# Patient Record
Sex: Male | Born: 1990 | Race: Black or African American | Hispanic: No | Marital: Single | Smoking: Former smoker
Health system: Southern US, Community
[De-identification: ages and names within clinical notes are randomized; demographics above are authoritative.]

## PROBLEM LIST (undated history)

## (undated) DIAGNOSIS — J45909 Unspecified asthma, uncomplicated: Secondary | ICD-10-CM

---
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
## 2013-01-03 ENCOUNTER — Emergency Department (HOSPITAL_COMMUNITY): Payer: Self-pay

## 2013-01-03 ENCOUNTER — Emergency Department (INDEPENDENT_AMBULATORY_CARE_PROVIDER_SITE_OTHER)
Admission: EM | Admit: 2013-01-03 | Discharge: 2013-01-03 | Discharge status: Against Medical Advice | Payer: Self-pay | Source: Home / Self Care | Attending: Family Medicine | Admitting: Family Medicine

## 2013-01-03 ENCOUNTER — Encounter (HOSPITAL_COMMUNITY): Payer: Self-pay | Admitting: Emergency Medicine

## 2013-01-03 DIAGNOSIS — S59912A Unspecified injury of left forearm, initial encounter: Secondary | ICD-10-CM

## 2013-01-03 DIAGNOSIS — S6992XA Unspecified injury of left wrist, hand and finger(s), initial encounter: Secondary | ICD-10-CM

## 2013-01-03 DIAGNOSIS — S59919A Unspecified injury of unspecified forearm, initial encounter: Secondary | ICD-10-CM

## 2013-01-03 DIAGNOSIS — S6990XA Unspecified injury of unspecified wrist, hand and finger(s), initial encounter: Secondary | ICD-10-CM

## 2013-01-03 HISTORY — DX: Unspecified asthma, uncomplicated: J45.909

## 2013-01-03 NOTE — ED Notes (Signed)
Patient did not return and cannot be located
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
## 2013-01-03 NOTE — ED Notes (Signed)
Provided ice pack
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
## 2013-07-24 ENCOUNTER — Emergency Department (HOSPITAL_COMMUNITY): Payer: Self-pay

## 2013-07-24 ENCOUNTER — Encounter (HOSPITAL_COMMUNITY): Payer: Self-pay | Admitting: Emergency Medicine

## 2013-07-24 ENCOUNTER — Emergency Department (HOSPITAL_COMMUNITY)
Admission: EM | Admit: 2013-07-24 | Discharge: 2013-07-24 | Disposition: A | Discharge status: Home / Self Care | Payer: Self-pay | Attending: Emergency Medicine | Admitting: Emergency Medicine

## 2013-07-24 DIAGNOSIS — J069 Acute upper respiratory infection, unspecified: Secondary | ICD-10-CM | POA: Insufficient documentation

## 2013-07-24 DIAGNOSIS — J45901 Unspecified asthma with (acute) exacerbation: Secondary | ICD-10-CM | POA: Insufficient documentation

## 2013-07-24 DIAGNOSIS — R079 Chest pain, unspecified: Secondary | ICD-10-CM | POA: Insufficient documentation

## 2013-07-24 DIAGNOSIS — IMO0002 Reserved for concepts with insufficient information to code with codable children: Secondary | ICD-10-CM | POA: Insufficient documentation

## 2013-07-24 DIAGNOSIS — Z79899 Other long term (current) drug therapy: Secondary | ICD-10-CM | POA: Insufficient documentation

## 2013-07-24 MED ORDER — PREDNISONE 20 MG PO TABS: ORAL_TABLET | ORAL | Status: DC

## 2013-07-24 MED ORDER — HYDROCODONE-ACETAMINOPHEN 5-325 MG PO TABS: 2.0000 | ORAL_TABLET | Freq: Four times a day (QID) | ORAL | Status: DC | PRN

## 2013-07-24 MED ORDER — ALBUTEROL SULFATE HFA 108 (90 BASE) MCG/ACT IN AERS: 2.0000 | INHALATION_SPRAY | RESPIRATORY_TRACT | Status: DC | PRN

## 2013-07-24 NOTE — ED Provider Notes (Signed)
CSN: [id number]     Arrival date & time 07/24/13  1718 History   First MD Initiated Contact with Patient 07/24/13 1956     Chief Complaint  Patient presents with  . Cough  . URI   (Consider location/radiation/quality/duration/timing/severity/associated sxs/prior Treatment) HPI 22 year old male presents with 2 days of cough nasal congestion chest pain only with cough, felt like he was wheezing at home earlier but no wheezing or shortness of breath now, has known he was a child and felt similar earlier today, no fever no confusion no rash no vomiting no diarrhea no body aches and no treatment prior to arrival earlier felt like an inhaler would have helped.  Past Medical History  Diagnosis Date  . Asthma    History reviewed. No pertinent past surgical history. History reviewed. No pertinent family history. History  Substance Use Topics  . Smoking status: Never Smoker   . Smokeless tobacco: Not on file  . Alcohol Use: Yes    Review of Systems 10 Systems reviewed and are negative for acute change except as noted in the HPI. Allergies  Review of patient's allergies indicates no known allergies.  Home Medications   Current Outpatient Rx  Name  Route  Sig  Dispense  Refill  . guaiFENesin (MUCINEX) 600 MG 12 hr tablet   Oral   Take 1,200 mg by mouth 2 (two) times daily as needed for cough or to loosen phlegm.         . Pseudoephedrine-APAP-DM (DAYQUIL PO)   Oral   Take 2 capsules by mouth every 6 (six) hours as needed (for cold symptoms).         Marland Kitchen albuterol (PROVENTIL HFA;VENTOLIN HFA) 108 (90 BASE) MCG/ACT inhaler   Inhalation   Inhale 2 puffs into the lungs every 2 (two) hours as needed for wheezing or shortness of breath (cough).   1 Inhaler   0   . HYDROcodone-acetaminophen (NORCO) 5-325 MG per tablet   Oral   Take 2 tablets by mouth every 6 (six) hours as needed for severe pain.   10 tablet   0   . predniSONE (DELTASONE) 20 MG tablet      3 tabs po daily x 3  days   9 tablet   0    BP 110/60  Pulse 67  Temp(Src) 98 F (36.7 C) (Oral)  Resp 20  SpO2 100% Physical Exam  Nursing note and vitals reviewed. Constitutional:  Awake, alert, nontoxic appearance.  HENT:  Head: Atraumatic.  Eyes: Right eye exhibits no discharge. Left eye exhibits no discharge.  Neck: Neck supple.  Cardiovascular: Normal rate and regular rhythm.   No murmur heard. Pulmonary/Chest: Effort normal and breath sounds normal. No respiratory distress. He has no wheezes. He has no rales. He exhibits tenderness.  Pulse oximetry normal room air 98%  Abdominal: Soft. He exhibits no distension. There is no tenderness. There is no rebound and no guarding.  Musculoskeletal: He exhibits no tenderness.  Baseline ROM, no obvious new focal weakness.  Neurological: He is alert.  Mental status and motor strength appears baseline for patient and situation.  Skin: No rash noted.  Psychiatric: He has a normal mood and affect.    ED Course  Procedures (including critical care time) Patient / Family / Caregiver informed of clinical course, understand medical decision-making process, and agree with plan. Labs Review Labs Reviewed - No data to display Imaging Review Dg Chest 2 View (if Patient Has Fever And/or Copd)  07/24/2013  CLINICAL DATA:  COUGH URI,  EXAM: CHEST  2 VIEW  COMPARISON:  DG CHEST 2 VIEW dated 07/15/2007  FINDINGS: Normal mediastinum and cardiac silhouette. Normal pulmonary vasculature. No evidence of effusion, infiltrate, or pneumothorax. No acute bony abnormality.  IMPRESSION: Normal chest radiograph   Electronically Signed   By: Genevive Bi M.D.   On: 07/24/2013 18:03    EKG Interpretation   None       MDM   1. URI (upper respiratory infection)    I doubt any other EMC precluding discharge at this time including, but not necessarily limited to the following:SBI.    Hurman Horn, MD 07/24/13 2125

## 2013-07-24 NOTE — ED Notes (Signed)
Pt c/o nasal congestion and URI sx with cough that is painful x 2 days

## 2013-10-25 ENCOUNTER — Encounter (HOSPITAL_COMMUNITY): Payer: Self-pay | Admitting: Emergency Medicine

## 2013-10-25 DIAGNOSIS — Z79899 Other long term (current) drug therapy: Secondary | ICD-10-CM | POA: Insufficient documentation

## 2013-10-25 DIAGNOSIS — Z792 Long term (current) use of antibiotics: Secondary | ICD-10-CM | POA: Insufficient documentation

## 2013-10-25 DIAGNOSIS — J45909 Unspecified asthma, uncomplicated: Secondary | ICD-10-CM | POA: Insufficient documentation

## 2013-10-25 DIAGNOSIS — K029 Dental caries, unspecified: Secondary | ICD-10-CM | POA: Insufficient documentation

## 2013-10-25 DIAGNOSIS — K047 Periapical abscess without sinus: Secondary | ICD-10-CM | POA: Insufficient documentation

## 2013-10-25 DIAGNOSIS — IMO0002 Reserved for concepts with insufficient information to code with codable children: Secondary | ICD-10-CM | POA: Insufficient documentation

## 2013-10-25 NOTE — ED Notes (Signed)
Pt. reports progressing left lower molar pain with left lower jaw swelling onset yesterday unrelieved by OTC Ibuprofen .

## 2013-10-26 ENCOUNTER — Emergency Department (HOSPITAL_COMMUNITY)
Admission: EM | Admit: 2013-10-26 | Discharge: 2013-10-26 | Disposition: A | Discharge status: Home / Self Care | Payer: Self-pay | Attending: Emergency Medicine | Admitting: Emergency Medicine

## 2013-10-26 DIAGNOSIS — K047 Periapical abscess without sinus: Secondary | ICD-10-CM

## 2013-10-26 MED ORDER — HYDROCODONE-ACETAMINOPHEN 5-325 MG PO TABS: 2.0000 | ORAL_TABLET | ORAL | Status: DC | PRN

## 2013-10-26 MED ORDER — AMOXICILLIN 500 MG PO CAPS: 500.0000 mg | ORAL_CAPSULE | Freq: Three times a day (TID) | ORAL | Status: DC

## 2013-10-26 NOTE — Discharge Instructions (Signed)
Dental Abscess A dental abscess is a collection of infected fluid (pus) from a bacterial infection in the inner part of the tooth (pulp). It usually occurs at the end of the tooth's root.  CAUSES   Severe tooth decay.  Trauma to the tooth that allows bacteria to enter into the pulp, such as a broken or chipped tooth. SYMPTOMS   Severe pain in and around the infected tooth.  Swelling and redness around the abscessed tooth or in the mouth or face.  Tenderness.  Pus drainage.  Bad breath.  Bitter taste in the mouth.  Difficulty swallowing.  Difficulty opening the mouth.  Nausea.  Vomiting.  Chills.  Swollen neck glands. DIAGNOSIS   A medical and dental history will be taken.  An examination will be performed by tapping on the abscessed tooth.  X-rays may be taken of the tooth to identify the abscess. TREATMENT The goal of treatment is to eliminate the infection. You may be prescribed antibiotic medicine to stop the infection from spreading. A root canal may be performed to save the tooth. If the tooth cannot be saved, it may be pulled (extracted) and the abscess may be drained.  HOME CARE INSTRUCTIONS  Only take over-the-counter or prescription medicines for pain, fever, or discomfort as directed by your caregiver.  Rinse your mouth (gargle) often with salt water ( tsp salt in 8 oz [250 ml] of warm water) to relieve pain or swelling.  Do not drive after taking pain medicine (narcotics).  Do not apply heat to the outside of your face.  Return to your dentist for further treatment as directed. SEEK MEDICAL CARE IF:  Your pain is not helped by medicine.  Your pain is getting worse instead of better. SEEK IMMEDIATE MEDICAL CARE IF:  You have a fever or persistent symptoms for more than 2 3 days.  You have a fever and your symptoms suddenly get worse.  You have chills or a very bad headache.  You have problems breathing or swallowing.  You have trouble  opening your mouth.  You have swelling in the neck or around the eye. Document Released: 07/12/2005 Document Revised: 04/05/2012 Document Reviewed: 10/20/2010 [facility] Patient Information 2014 [place], [place].  Dental Care and Dentist Visits Dental care supports good overall health. Regular dental visits can also help you avoid dental pain, bleeding, infection, and other more serious health problems in the future. It is important to keep the mouth healthy because diseases in the teeth, gums, and other oral tissues can spread to other areas of the body. Some problems, such as diabetes, heart disease, and pre-term labor have been associated with poor oral health.  See your dentist every 6 months. If you experience emergency problems such as a toothache or broken tooth, go to the dentist right away. If you see your dentist regularly, you may catch problems early. It is easier to be treated for problems in the early stages.  WHAT TO EXPECT AT A DENTIST VISIT  Your dentist will look for many common oral health problems and recommend proper treatment. At your regular dental visit, you can expect:  Gentle cleaning of the teeth and gums. This includes scraping and polishing. This helps to remove the sticky substance around the teeth and gums (plaque). Plaque forms in the mouth shortly after eating. Over time, plaque hardens on the teeth as tartar. If tartar is not removed regularly, it can cause problems. Cleaning also helps remove stains.  Periodic X-rays. These pictures of the teeth  and supporting bone will help your dentist assess the health of your teeth.  Periodic fluoride treatments. Fluoride is a natural mineral shown to help strengthen teeth. Fluoride treatmentinvolves applying a fluoride gel or varnish to the teeth. It is most commonly done in children.  Examination of the mouth, tongue, jaws, teeth, and gums to look for any oral health problems, such as:  Cavities (dental caries). This is  decay on the tooth caused by plaque, sugar, and acid in the mouth. It is best to catch a cavity when it is small.  Inflammation of the gums caused by plaque buildup (gingivitis).  Problems with the mouth or malformed or misaligned teeth.  Oral cancer or other diseases of the soft tissues or jaws. KEEP YOUR TEETH AND GUMS HEALTHY For healthy teeth and gums, follow these general guidelines as well as your dentist's specific advice:  Have your teeth professionally cleaned at the dentist every 6 months.  Brush twice daily with a fluoride toothpaste.  Floss your teeth daily.  Ask your dentist if you need fluoride supplements, treatments, or fluoride toothpaste.  Eat a healthy diet. Reduce foods and drinks with added sugar.  Avoid smoking. TREATMENT FOR ORAL HEALTH PROBLEMS If you have oral health problems, treatment varies depending on the conditions present in your teeth and gums.  Your caregiver will most likely recommend good oral hygiene at each visit.  For cavities, gingivitis, or other oral health disease, your caregiver will perform a procedure to treat the problem. This is typically done at a separate appointment. Sometimes your caregiver will refer you to another dental specialist for specific tooth problems or for surgery. SEEK IMMEDIATE DENTAL CARE IF:  You have pain, bleeding, or soreness in the gum, tooth, jaw, or mouth area.  A permanent tooth becomes loose or separated from the gum socket.  You experience a blow or injury to the mouth or jaw area. Document Released: 03/24/2011 Document Revised: 10/04/2011 Document Reviewed: 03/24/2011 [facility] Patient Information 2014 [place]

## 2013-10-26 NOTE — ED Provider Notes (Signed)
CSN: [id number]     Arrival date & time 10/25/13  2225 History   First MD Initiated Contact with Patient 10/26/13 0003     Chief Complaint  Patient presents with  . Dental Pain     (Consider location/radiation/quality/duration/timing/severity/associated sxs/prior Treatment) HPI  Patient presents to the emergency department with a dental complaint. Symptoms began 2 days ago. The patient has tried to alleviate pain with ibuprofen.  Pain rated at a 10/10, characterized as throbbing in nature and located left lower molar. Patient denies fever, night sweats, chills, difficulty swallowing or opening mouth, SOB, nuchal rigidity or decreased ROM of neck.  Patient does not have a dentist and requests a resource guide at discharge.  Past Medical History  Diagnosis Date  . Asthma    History reviewed. No pertinent past surgical history. No family history on file. History  Substance Use Topics  . Smoking status: Never Smoker   . Smokeless tobacco: Not on file  . Alcohol Use: Yes    Review of Systems  The patient denies anorexia, fever, weight loss,, vision loss, decreased hearing, hoarseness, chest pain, syncope, dyspnea on exertion, peripheral edema, balance deficits, hemoptysis, abdominal pain, melena, hematochezia, severe indigestion/heartburn, hematuria, incontinence, genital sores, muscle weakness, suspicious skin lesions, transient blindness, difficulty walking, depression, unusual weight change, abnormal bleeding, enlarged lymph nodes, angioedema, and breast masses.    Allergies  Review of patient's allergies indicates no known allergies.  Home Medications   Current Outpatient Rx  Name  Route  Sig  Dispense  Refill  . albuterol (PROVENTIL HFA;VENTOLIN HFA) 108 (90 BASE) MCG/ACT inhaler   Inhalation   Inhale 2 puffs into the lungs every 2 (two) hours as needed for wheezing or shortness of breath (cough).   1 Inhaler   0   . amoxicillin (AMOXIL) 500 MG capsule   Oral   Take 1  capsule (500 mg total) by mouth 3 (three) times daily.   21 capsule   0   . guaiFENesin (MUCINEX) 600 MG 12 hr tablet   Oral   Take 1,200 mg by mouth 2 (two) times daily as needed for cough or to loosen phlegm.         Marland Kitchen HYDROcodone-acetaminophen (NORCO) 5-325 MG per tablet   Oral   Take 2 tablets by mouth every 6 (six) hours as needed for severe pain.   10 tablet   0   . HYDROcodone-acetaminophen (NORCO/VICODIN) 5-325 MG per tablet   Oral   Take 2 tablets by mouth every 4 (four) hours as needed.   6 tablet   0   . predniSONE (DELTASONE) 20 MG tablet      3 tabs po daily x 3 days   9 tablet   0   . Pseudoephedrine-APAP-DM (DAYQUIL PO)   Oral   Take 2 capsules by mouth every 6 (six) hours as needed (for cold symptoms).          BP 122/69  Pulse 60  Temp(Src) 97.8 F (36.6 C) (Oral)  Resp 14  Ht 5\' 5"  (1.651 m)  Wt 138 lb (62.596 kg)  BMI 22.96 kg/m2  SpO2 99% Physical Exam  Nursing note and vitals reviewed. Constitutional: He appears well-developed and well-nourished. No distress.  HENT:  Head: Normocephalic and atraumatic.  Mouth/Throat: Dental caries present.    Eyes: Conjunctivae and EOM are normal. Pupils are equal, round, and reactive to light.  Neck: Normal range of motion. Neck supple.  Cardiovascular: Normal rate and regular rhythm.  Pulmonary/Chest: Effort normal and breath sounds normal.  Abdominal: Soft.  Neurological: He is alert.  Skin: Skin is warm and dry.      ED Course  Procedures (including critical care time) Labs Review Labs Reviewed - No data to display Imaging Review No results found.   EKG Interpretation None      MDM   Final diagnoses:  Dental abscess    Patient has dental pain. No emergent s/sx's present. Patent airway. No trismus.  Will be given pain medication and antibiotics. I discussed the need to call dentist within 24/48 hours for follow-up. Dental referral given. Return to ED precautions given.  Pt  voiced understanding and has agreed to follow-up.   23 y.o.Vincent West's evaluation in the Emergency Department is complete. It has been determined that no acute conditions requiring further emergency intervention are present at this time. The patient/guardian have been advised of the diagnosis and plan. We have discussed signs and symptoms that warrant return to the ED, such as changes or worsening in symptoms.  Vital signs are stable at discharge. Filed Vitals:   10/25/13 2313  BP: 122/69  Pulse: 60  Temp: 97.8 F (36.6 C)  Resp: 14    Patient/guardian has voiced understanding and agreed to follow-up with the PCP or specialist.     Dorthula Matasiffany G Zyra Parrillo, PA-C 10/26/13 0025

## 2013-10-27 NOTE — ED Provider Notes (Signed)
Medical screening examination/treatment/procedure(s) were performed by non-physician practitioner and as supervising physician I was immediately available for consultation/collaboration.   EKG Interpretation None        Gilda Creasehristopher J. Bobetta Korf, MD 10/27/13 [phone]

## 2013-11-03 ENCOUNTER — Emergency Department (HOSPITAL_COMMUNITY)
Admission: EM | Admit: 2013-11-03 | Discharge: 2013-11-03 | Disposition: A | Discharge status: Home / Self Care | Payer: Self-pay | Attending: Emergency Medicine | Admitting: Emergency Medicine

## 2013-11-03 ENCOUNTER — Encounter (HOSPITAL_COMMUNITY): Payer: Self-pay | Admitting: Emergency Medicine

## 2013-11-03 DIAGNOSIS — Z79899 Other long term (current) drug therapy: Secondary | ICD-10-CM | POA: Insufficient documentation

## 2013-11-03 DIAGNOSIS — J45909 Unspecified asthma, uncomplicated: Secondary | ICD-10-CM | POA: Insufficient documentation

## 2013-11-03 DIAGNOSIS — J039 Acute tonsillitis, unspecified: Secondary | ICD-10-CM | POA: Insufficient documentation

## 2013-11-03 DIAGNOSIS — J029 Acute pharyngitis, unspecified: Secondary | ICD-10-CM

## 2013-11-03 DIAGNOSIS — F17200 Nicotine dependence, unspecified, uncomplicated: Secondary | ICD-10-CM | POA: Insufficient documentation

## 2013-11-03 LAB — RAPID STREP SCREEN (MED CTR MEBANE ONLY): Streptococcus, Group A Screen (Direct): NEGATIVE

## 2013-11-03 MED ORDER — IBUPROFEN 400 MG PO TABS
800.0000 mg | ORAL_TABLET | Freq: Once | ORAL | Status: AC
  Administered 2013-11-03: 800 mg via ORAL
  Filled 2013-11-03: qty 2

## 2013-11-03 MED ORDER — IBUPROFEN 800 MG PO TABS
800.0000 mg | ORAL_TABLET | Freq: Three times a day (TID) | ORAL | Status: DC
Start: 2013-11-03 — End: 2015-01-19

## 2013-11-03 MED ORDER — PENICILLIN G BENZATHINE 1200000 UNIT/2ML IM SUSP
1.2000 10*6.[IU] | Freq: Once | INTRAMUSCULAR | Status: AC
  Administered 2013-11-03: 1.2 10*6.[IU] via INTRAMUSCULAR
  Filled 2013-11-03: qty 2

## 2013-11-03 NOTE — Discharge Instructions (Signed)
Treated today for her tonsillitis and covered for strep throat. Take ibuprofen as directed for pain and swelling. Rest and stay well-hydrated.  Pharyngitis Pharyngitis is redness, pain, and swelling (inflammation) of your pharynx.  CAUSES  Pharyngitis is usually caused by infection. Most of the time, these infections are from viruses (viral) and are part of a cold. However, sometimes pharyngitis is caused by bacteria (bacterial). Pharyngitis can also be caused by allergies. Viral pharyngitis may be spread from person to person by coughing, sneezing, and personal items or utensils (cups, forks, spoons, toothbrushes). Bacterial pharyngitis may be spread from person to person by more intimate contact, such as kissing.  SIGNS AND SYMPTOMS  Symptoms of pharyngitis include:   Sore throat.   Tiredness (fatigue).   Low-grade fever.   Headache.  Joint pain and muscle aches.  Skin rashes.  Swollen lymph nodes.  Plaque-like film on throat or tonsils (often seen with bacterial pharyngitis). DIAGNOSIS  Your health care provider will ask you questions about your illness and your symptoms. Your medical history, along with a physical exam, is often all that is needed to diagnose pharyngitis. Sometimes, a rapid strep test is done. Other lab tests may also be done, depending on the suspected cause.  TREATMENT  Viral pharyngitis will usually get better in 3 4 days without the use of medicine. Bacterial pharyngitis is treated with medicines that kill germs (antibiotics).  HOME CARE INSTRUCTIONS   Drink enough water and fluids to keep your urine clear or pale yellow.   Only take over-the-counter or prescription medicines as directed by your health care provider:   If you are prescribed antibiotics, make sure you finish them even if you start to feel better.   Do not take aspirin.   Get lots of rest.   Gargle with 8 oz of salt water ( tsp of salt per 1 qt of water) as often as every 1 2  hours to soothe your throat.   Throat lozenges (if you are not at risk for choking) or sprays may be used to soothe your throat. SEEK MEDICAL CARE IF:   You have large, tender lumps in your neck.  You have a rash.  You cough up green, yellow-brown, or bloody spit. SEEK IMMEDIATE MEDICAL CARE IF:   Your neck becomes stiff.  You drool or are unable to swallow liquids.  You vomit or are unable to keep medicines or liquids down.  You have severe pain that does not go away with the use of recommended medicines.  You have trouble breathing (not caused by a stuffy nose). MAKE SURE YOU:   Understand these instructions.  Will watch your condition.  Will get help right away if you are not doing well or get worse. Document Released: 07/12/2005 Document Revised: 05/02/2013 Document Reviewed: 03/19/2013 [facility] Patient Information 2014 [place], [place].  Tonsillitis Tonsillitis is an infection of the throat that causes the tonsils to become red, tender, and swollen. Tonsils are collections of lymphoid tissue at the back of the throat. Each tonsil has crevices (crypts). Tonsils help fight nose and throat infections and keep infection from spreading to other parts of the body for the first 18 months of life.  CAUSES Sudden (acute) tonsillitis is usually caused by infection with streptococcal bacteria. Long-lasting (chronic) tonsillitis occurs when the crypts of the tonsils become filled with pieces of food and bacteria, which makes it easy for the tonsils to become repeatedly infected. SYMPTOMS  Symptoms of tonsillitis include:  A sore throat, with  possible difficulty swallowing.  White patches on the tonsils.  Fever.  Tiredness.  New episodes of snoring during sleep, when you did not snore before.  Small, foul-smelling, yellowish-white pieces of material (tonsilloliths) that you occasionally cough up or spit out. The tonsilloliths can also cause you to have bad  breath. DIAGNOSIS Tonsillitis can be diagnosed through a physical exam. Diagnosis can be confirmed with the results of lab tests, including a throat culture. TREATMENT  The goals of tonsillitis treatment include the reduction of the severity and duration of symptoms and prevention of associated conditions. Symptoms of tonsillitis can be improved with the use of steroids to reduce the swelling. Tonsillitis caused by bacteria can be treated with antibiotics. Usually, treatment with antibiotics is started before the cause of the tonsillitis is known. However, if it is determined that the cause is not bacterial, antibiotics will not treat the tonsillitis. If attacks of tonsillitis are severe and frequent, your caregiver may recommend surgery to remove the tonsils (tonsillectomy). HOME CARE INSTRUCTIONS   Rest as much as possible and get plenty of sleep.  Drink plenty of fluids. While the throat is very sore, eat soft foods or liquids, such as sherbet, soups, or instant breakfast drinks.  Eat frozen ice pops.  Gargle with a warm or cold liquid to help soothe the throat. Mix 1/4 teaspoon of salt and 1/4 teaspoon of baking soda in in 8 oz of water. SEEK MEDICAL CARE IF:   Large, tender lumps develop in your neck.  A rash develops.  A green, yellow-brown, or bloody substance is coughed up.  You are unable to swallow liquids or food for 24 hours.  You notice that only one of the tonsils is swollen. SEEK IMMEDIATE MEDICAL CARE IF:   You develop any new symptoms such as vomiting, severe headache, stiff neck, chest pain, or trouble breathing or swallowing.  You have severe throat pain along with drooling or voice changes.  You have severe pain, unrelieved with recommended medications.  You are unable to fully open the mouth.  You develop redness, swelling, or severe pain anywhere in the neck.  You have a fever. MAKE SURE YOU:   Understand these instructions.  Will watch your  condition.  Will get help right away if you are not doing well or get worse. Document Released: 04/21/2005 Document Revised: 03/14/2013 Document Reviewed: 12/29/2012 [facility] Patient Information 2014 [place], [place].

## 2013-11-03 NOTE — ED Provider Notes (Signed)
CSN: [id number]     Arrival date & time 11/03/13  1848 History  This chart was scribed for non-physician practitioner Johnnette Gourdobyn Albert, PA working with Richardean Canalavid H Yao, MD by Elveria Risingimelie Horne, ED Scribe. This patient was seen in room TR07C/TR07C and the patient's care was started at 7:49 PM.   Chief Complaint  Patient presents with  . Sore Throat      The history is provided by the patient. No language interpreter was used.   HPI Comments: Vincent West is a 23 y.o. male who presents to the Emergency Department complaining of sore throat onset, yesterday. Today he says he woke and the sore throat had worsened. Patient reports pain with swallowing: rated at 8/10. Patient is able to tolerably swallow liquids, but not food. Associated symptoms include lightheadedness, fever, myalgia and headache. Patient attempted treatment with ibuprofen and rest and experienced relief of his fever. Patient denies sick contacts.     Past Medical History  Diagnosis Date  . Asthma    History reviewed. No pertinent past surgical history. No family history on file. History  Substance Use Topics  . Smoking status: Current Every Day Smoker  . Smokeless tobacco: Not on file  . Alcohol Use: Yes    Review of Systems A complete 10 system review of systems was obtained and all systems are negative except as noted in the HPI and PMH.   Allergies  Review of patient's allergies indicates no known allergies.  Home Medications   Current Outpatient Rx  Name  Route  Sig  Dispense  Refill  . albuterol (PROVENTIL HFA;VENTOLIN HFA) 108 (90 BASE) MCG/ACT inhaler   Inhalation   Inhale 2 puffs into the lungs every 2 (two) hours as needed for wheezing or shortness of breath (cough).   1 Inhaler   0   . ibuprofen (ADVIL,MOTRIN) 800 MG tablet   Oral   Take 1 tablet (800 mg total) by mouth 3 (three) times daily.   21 tablet   0    BP 130/77  Pulse 90  Temp(Src) 98.6 F (37 C) (Oral)  Resp 20  Ht 5\' 5"  (1.651 m)  Wt  138 lb (62.596 kg)  BMI 22.96 kg/m2  SpO2 100%  Physical Exam  Nursing note and vitals reviewed. Constitutional: He is oriented to person, place, and time. He appears well-developed and well-nourished. No distress.  HENT:  Head: Normocephalic and atraumatic.  Mouth/Throat: Uvula is midline and mucous membranes are normal. Oropharyngeal exudate, posterior oropharyngeal edema and posterior oropharyngeal erythema present. No tonsillar abscesses.  Tonsils enlarged and inflamed bilateral with exudate. No tonsillar abscess.   Eyes: Conjunctivae and EOM are normal.  Neck: Normal range of motion. Neck supple.  Cardiovascular: Normal rate, regular rhythm and normal heart sounds.   Pulmonary/Chest: Effort normal and breath sounds normal.  Musculoskeletal: Normal range of motion. He exhibits no edema.  Lymphadenopathy:       Head (right side): Tonsillar adenopathy present.       Head (left side): Tonsillar adenopathy present.    He has cervical adenopathy.  Neurological: He is alert and oriented to person, place, and time.  Skin: Skin is warm and dry.  Psychiatric: He has a normal mood and affect. His behavior is normal.    ED Course  Procedures (including critical care time) DIAGNOSTIC STUDIES: Oxygen Saturation is 100% on room air, normal by my interpretation.    COORDINATION OF CARE: 7:06 PM- Pt advised of plan for treatment and pt agrees.  Labs Review Labs Reviewed  RAPID STREP SCREEN  CULTURE, GROUP A STREP   Imaging Review No results found.   EKG Interpretation None      MDM   Final diagnoses:  Pharyngitis  Tonsillitis with exudate   Patient well-appearing and in no apparent distress, afebrile with normal vital signs. Tonsils and oropharynx enlarged and inflamed bilateral with exudate, will treat with Bicillin 1.2 million units IM despite rapid strep negative. Advised ibuprofen for pain. Swallows liquids in the ED without difficulty. Stable for discharge. Return  precautions given. Patient states understanding of treatment care plan and is agreeable.    I personally performed the services described in this documentation, which was scribed in my presence. The recorded information has been reviewed and is accurate.   Trevor Mace, PA-C 11/03/13 1952

## 2013-11-03 NOTE — ED Notes (Addendum)
Patient complaining of dizziness, headache, and body aches all over after waking up with a sore throat starting yesterday morning.

## 2013-11-03 NOTE — ED Provider Notes (Signed)
Medical screening examination/treatment/procedure(s) were performed by non-physician practitioner and as supervising physician I was immediately available for consultation/collaboration.   EKG Interpretation None        Dashon Mcintire H Boomer Winders, MD 11/03/13 2319 

## 2013-11-03 NOTE — ED Notes (Addendum)
Pt reports sore throat onset yesterday, back pain and headache. Pt tried ibuprofen with some relief. Pt presents with redness and swelling to tonsil area

## 2013-11-05 LAB — CULTURE, GROUP A STREP

## 2014-11-07 ENCOUNTER — Encounter (HOSPITAL_COMMUNITY): Payer: Self-pay | Admitting: Emergency Medicine

## 2014-11-07 ENCOUNTER — Emergency Department (HOSPITAL_COMMUNITY)
Admission: EM | Admit: 2014-11-07 | Discharge: 2014-11-07 | Disposition: A | Discharge status: Home / Self Care | Payer: Self-pay | Attending: Emergency Medicine | Admitting: Emergency Medicine

## 2014-11-07 DIAGNOSIS — Z720 Tobacco use: Secondary | ICD-10-CM | POA: Insufficient documentation

## 2014-11-07 DIAGNOSIS — J029 Acute pharyngitis, unspecified: Secondary | ICD-10-CM | POA: Insufficient documentation

## 2014-11-07 DIAGNOSIS — J45909 Unspecified asthma, uncomplicated: Secondary | ICD-10-CM | POA: Insufficient documentation

## 2014-11-07 MED ORDER — AMOXICILLIN 500 MG PO CAPS: 500.0000 mg | ORAL_CAPSULE | Freq: Three times a day (TID) | ORAL | Status: DC

## 2014-11-07 NOTE — Discharge Instructions (Signed)

## 2014-11-07 NOTE — ED Provider Notes (Signed)
CSN: [id number]     Arrival date & time 11/07/14  1446 History   This chart was scribed for Arthor Captain, PA-C working with Vanetta Mulders, MD by Evon Slack, ED Scribe. This patient was seen in room TR04C/TR04C and the patient's care was started at 3:02 PM.   Chief Complaint  Patient presents with  . Sore Throat   The history is provided by the patient. No language interpreter was used.   HPI Comments: Vincent West is a 24 y.o. male who presents to the Emergency Department complaining of  newsore throat onset 1 day ago. Pt states that he woke up with sore throat that has been progressively worsening. Pt reports associated subject fever, chills and nausea. Pt doesn't report any alleviating factors.  Pt states that he is unsure of sick contacts. Pt denies any other associated symptoms.   Past Medical History  Diagnosis Date  . Asthma    History reviewed. No pertinent past surgical history. History reviewed. No pertinent family history. History  Substance Use Topics  . Smoking status: Current Every Day Smoker  . Smokeless tobacco: Not on file  . Alcohol Use: Yes    Review of Systems  Constitutional: Positive for fever and chills.  HENT: Positive for sore throat. Negative for trouble swallowing and voice change.   Gastrointestinal: Positive for nausea.  All other systems reviewed and are negative.     Allergies  Review of patient's allergies indicates no known allergies.  Home Medications   Prior to Admission medications   Medication Sig Start Date End Date Taking? Authorizing Provider  albuterol (PROVENTIL HFA;VENTOLIN HFA) 108 (90 BASE) MCG/ACT inhaler Inhale 2 puffs into the lungs every 2 (two) hours as needed for wheezing or shortness of breath (cough). 07/24/13   Wayland Salinas, MD  ibuprofen (ADVIL,MOTRIN) 800 MG tablet Take 1 tablet (800 mg total) by mouth 3 (three) times daily. 11/03/13   Robyn M Hess, PA-C   Pulse 93  Temp(Src) 98.2 F (36.8 C) (Oral)  Resp  18  SpO2 98%   Physical Exam  Constitutional: He is oriented to person, place, and time. He appears well-developed and well-nourished. No distress.  HENT:  Head: Normocephalic and atraumatic.  Mouth/Throat: Uvula swelling present. Oropharyngeal exudate, posterior oropharyngeal edema and posterior oropharyngeal erythema present.  tonsillar hypertrophy with bilateral exudates, erythema and swelling of the uvula he is able to tolerate his own secretions.   Eyes: Conjunctivae and EOM are normal.  Neck: Neck supple. No tracheal deviation present.  Cardiovascular: Normal rate.   Pulmonary/Chest: Effort normal. No respiratory distress.  Musculoskeletal: Normal range of motion.  Neurological: He is alert and oriented to person, place, and time.  Skin: Skin is warm and dry.  Psychiatric: He has a normal mood and affect. His behavior is normal.  Nursing note and vitals reviewed.   ED Course  Procedures (including critical care time) DIAGNOSTIC STUDIES: Oxygen Saturation is 98% on RA, normal by my interpretation.    COORDINATION OF CARE: 3:10 PM-Discussed treatment plan with pt at bedside and pt agreed to plan.     Labs Review Labs Reviewed - No data to display  Imaging Review No results found.   EKG Interpretation None      MDM   Final diagnoses:  Acute pharyngitis, unspecified pharyngitis type   Patient with swelling, erythema exudate. D/c with amoxil. Presentation non concerning for PTA or infxn spread to soft tissue. No trismus or uvula deviation. Specific return precautions discussed. Pt able to drink  water in ED without difficulty with intact air way. Recommended PCP follow up.      I personally performed the services described in this documentation, which was scribed in my presence. The recorded information has been reviewed and is accurate.        Arthor Captainbigail Vianny Schraeder, PA-C 11/11/14 1758  Vanetta MuldersScott Zackowski, MD 11/15/14 [phone]

## 2014-11-07 NOTE — ED Notes (Signed)
Pt reports the right side of tonsil is sore; white patches and swelling in upper area. No changes in voice.

## 2015-01-19 ENCOUNTER — Encounter (HOSPITAL_COMMUNITY): Payer: Self-pay | Admitting: Emergency Medicine

## 2015-01-19 ENCOUNTER — Emergency Department (HOSPITAL_COMMUNITY)
Admission: EM | Admit: 2015-01-19 | Discharge: 2015-01-19 | Disposition: A | Discharge status: Home / Self Care | Payer: Self-pay | Attending: Emergency Medicine | Admitting: Emergency Medicine

## 2015-01-19 DIAGNOSIS — J45909 Unspecified asthma, uncomplicated: Secondary | ICD-10-CM | POA: Insufficient documentation

## 2015-01-19 DIAGNOSIS — R110 Nausea: Secondary | ICD-10-CM | POA: Insufficient documentation

## 2015-01-19 DIAGNOSIS — Z720 Tobacco use: Secondary | ICD-10-CM | POA: Insufficient documentation

## 2015-01-19 DIAGNOSIS — F10120 Alcohol abuse with intoxication, uncomplicated: Secondary | ICD-10-CM | POA: Insufficient documentation

## 2015-01-19 LAB — CBC WITH DIFFERENTIAL/PLATELET
Basophils Absolute: 0 10*3/uL (ref 0.0–0.1)
Basophils Relative: 0 % (ref 0–1)
Eosinophils Absolute: 0.1 10*3/uL (ref 0.0–0.7)
Eosinophils Relative: 1 % (ref 0–5)
HCT: 40.8 % (ref 39.0–52.0)
Hemoglobin: 14.7 g/dL (ref 13.0–17.0)
Lymphocytes Relative: 9 % — ABNORMAL LOW (ref 12–46)
Lymphs Abs: 1.3 10*3/uL (ref 0.7–4.0)
MCH: 30.2 pg (ref 26.0–34.0)
MCHC: 36 g/dL (ref 30.0–36.0)
MCV: 84 fL (ref 78.0–100.0)
Monocytes Absolute: 1.3 10*3/uL — ABNORMAL HIGH (ref 0.1–1.0)
Monocytes Relative: 9 % (ref 3–12)
Neutro Abs: 11.5 10*3/uL — ABNORMAL HIGH (ref 1.7–7.7)
Neutrophils Relative %: 81 % — ABNORMAL HIGH (ref 43–77)
Platelets: 270 10*3/uL (ref 150–400)
RBC: 4.86 MIL/uL (ref 4.22–5.81)
RDW: 13.3 % (ref 11.5–15.5)
WBC: 14.2 10*3/uL — ABNORMAL HIGH (ref 4.0–10.5)

## 2015-01-19 LAB — URINE MICROSCOPIC-ADD ON

## 2015-01-19 LAB — URINALYSIS, ROUTINE W REFLEX MICROSCOPIC
Bilirubin Urine: NEGATIVE
Glucose, UA: NEGATIVE mg/dL
Hgb urine dipstick: NEGATIVE
Ketones, ur: 15 mg/dL — AB
Nitrite: NEGATIVE
Protein, ur: NEGATIVE mg/dL
Specific Gravity, Urine: 1.03 (ref 1.005–1.030)
Urobilinogen, UA: 0.2 mg/dL (ref 0.0–1.0)
pH: 6 (ref 5.0–8.0)

## 2015-01-19 LAB — COMPREHENSIVE METABOLIC PANEL
ALT: 13 U/L — ABNORMAL LOW (ref 17–63)
AST: 30 U/L (ref 15–41)
Albumin: 4.5 g/dL (ref 3.5–5.0)
Alkaline Phosphatase: 59 U/L (ref 38–126)
Anion gap: 12 (ref 5–15)
BUN: 11 mg/dL (ref 6–20)
CO2: 22 mmol/L (ref 22–32)
Calcium: 9.1 mg/dL (ref 8.9–10.3)
Chloride: 106 mmol/L (ref 101–111)
Creatinine, Ser: 0.97 mg/dL (ref 0.61–1.24)
GFR calc Af Amer: >60 mL/min (ref >60)
GFR calc non Af Amer: >60 mL/min (ref >60)
Glucose, Bld: 103 mg/dL — ABNORMAL HIGH (ref 65–99)
Potassium: 4 mmol/L (ref 3.5–5.1)
Sodium: 140 mmol/L (ref 135–145)
Total Bilirubin: 0.7 mg/dL (ref 0.3–1.2)
Total Protein: 7.5 g/dL (ref 6.5–8.1)

## 2015-01-19 LAB — ETHANOL: Alcohol, Ethyl (B): <5 mg/dL (ref <5)

## 2015-01-19 LAB — LIPASE, BLOOD: Lipase: 19 U/L — ABNORMAL LOW (ref 22–51)

## 2015-01-19 MED ORDER — SODIUM CHLORIDE 0.9 % IV BOLUS (SEPSIS)
1000.0000 mL | Freq: Once | INTRAVENOUS | Status: AC
  Administered 2015-01-19: 1000 mL via INTRAVENOUS

## 2015-01-19 MED ORDER — PROMETHAZINE HCL 25 MG PO TABS: 25.0000 mg | ORAL_TABLET | Freq: Three times a day (TID) | ORAL | Status: DC | PRN

## 2015-01-19 MED ORDER — PROMETHAZINE HCL 25 MG/ML IJ SOLN
25.0000 mg | Freq: Once | INTRAMUSCULAR | Status: AC
  Administered 2015-01-19: 25 mg via INTRAVENOUS
  Filled 2015-01-19: qty 1

## 2015-01-19 NOTE — ED Notes (Addendum)
Pt here due to new onset dizziness, nauseas/vomitting, and intermittent numbness to left arm. Pt reports going to a party last night and having some "white liquor" - pt states he felt fine until this morning. Pt has had 1 episode of vomiting. Pt is dizzy and states he fell in bathroom in ED waiting room, unwitnessed; however pt denies LOC and denies hitting head, fell to right side, no deformities, denies pain from fall. Pt reports pain 7/10 in abdomen in RUQ. Last BM yesterday. Denies diarrhea. Pt is a/o x4.

## 2015-01-19 NOTE — Discharge Instructions (Signed)
Return here as needed.  Follow-up with a primary care Dr. increase her fluid intake, rest as much as possible

## 2015-01-19 NOTE — ED Provider Notes (Signed)
CSN: [id number]     Arrival date & time 01/19/15  2993 History   First MD Initiated Contact with Patient 01/19/15 [phone]     Chief Complaint  Patient presents with  . Numbness  . Nausea  . Dizziness     (Consider location/radiation/quality/duration/timing/severity/associated sxs/prior Treatment) HPI Patient presents to the emergency department with nausea, vomiting, dizziness that started this morning.  The patient states that he has not gone to bed and he was out partying last night.  He states that during his vomiting episodes.  He did not notice dizziness with intermittent numbness in his arms.  The patient states that he drank some white liquor out of water bottles.  The patient states that he did not have any chest pain, shortness of breath, headache, blurred vision, weakness, abdominal pain, diarrhea, bloody stool, dysuria, or syncope.  The patient states that the thing seems make his condition, better or worse.  He did not take any medications prior to arrival for her symptoms Past Medical History  Diagnosis Date  . Asthma    History reviewed. No pertinent past surgical history. History reviewed. No pertinent family history. History  Substance Use Topics  . Smoking status: Current Every Day Smoker -- 0.50 packs/day    Types: Cigarettes  . Smokeless tobacco: Not on file  . Alcohol Use: Yes    Review of Systems All other systems negative except as documented in the HPI. All pertinent positives and negatives as reviewed in the HPI.   Allergies  Review of patient's allergies indicates no known allergies.  Home Medications   Prior to Admission medications   Not on File   BP 98/60 mmHg  Pulse 100  Temp(Src) 97.6 F (36.4 C) (Oral)  Resp 19  SpO2 98% Physical Exam  Constitutional: He is oriented to person, place, and time. He appears well-developed and well-nourished. No distress.  HENT:  Head: Normocephalic and atraumatic.  Mouth/Throat: Oropharynx is clear and moist.   Eyes: Pupils are equal, round, and reactive to light.  Neck: Normal range of motion. Neck supple.  Cardiovascular: Normal rate, regular rhythm and normal heart sounds.  Exam reveals no gallop and no friction rub.   No murmur heard. Pulmonary/Chest: Effort normal and breath sounds normal. No respiratory distress.  Abdominal: Soft. Bowel sounds are normal. He exhibits no distension. There is no tenderness.  Musculoskeletal: He exhibits no edema.  Neurological: He is alert and oriented to person, place, and time. He exhibits normal muscle tone. Coordination normal.  Skin: Skin is warm and dry. No rash noted. No erythema.  Psychiatric: He has a normal mood and affect. His behavior is normal.  Nursing note and vitals reviewed.   ED Course  Procedures (including critical care time) Labs Review Labs Reviewed  COMPREHENSIVE METABOLIC PANEL - Abnormal; Notable for the following:    Glucose, Bld 103 (*)    ALT 13 (*)    All other components within normal limits  CBC WITH DIFFERENTIAL/PLATELET - Abnormal; Notable for the following:    WBC 14.2 (*)    Neutrophils Relative % 81 (*)    Neutro Abs 11.5 (*)    Lymphocytes Relative 9 (*)    Monocytes Absolute 1.3 (*)    All other components within normal limits  URINALYSIS, ROUTINE W REFLEX MICROSCOPIC (NOT AT [facility]) - Abnormal; Notable for the following:    APPearance CLOUDY (*)    Ketones, ur 15 (*)    Leukocytes, UA SMALL (*)    All other  components within normal limits  LIPASE, BLOOD - Abnormal; Notable for the following:    Lipase 19 (*)    All other components within normal limits  ETHANOL  URINE MICROSCOPIC-ADD ON    Patient has been given IV fluids.  He is been resting comfortably in the room.  He will be discharged home.  This is most likely induced by alcohol intoxication.  Told to return here as needed.  Advised follow-up with a primary care doctor  Charlestine Night, PA-C 01/19/15 1328  Benjiman Core, MD 01/19/15 [phone]

## 2015-05-08 ENCOUNTER — Encounter (HOSPITAL_COMMUNITY): Payer: Self-pay | Admitting: Emergency Medicine

## 2015-05-08 ENCOUNTER — Emergency Department (HOSPITAL_COMMUNITY)
Admission: EM | Admit: 2015-05-08 | Discharge: 2015-05-08 | Disposition: A | Discharge status: Home / Self Care | Payer: Self-pay | Attending: Emergency Medicine | Admitting: Emergency Medicine

## 2015-05-08 DIAGNOSIS — F1910 Other psychoactive substance abuse, uncomplicated: Secondary | ICD-10-CM | POA: Insufficient documentation

## 2015-05-08 DIAGNOSIS — Z720 Tobacco use: Secondary | ICD-10-CM | POA: Insufficient documentation

## 2015-05-08 LAB — LIPASE, BLOOD: Lipase: 19 U/L — ABNORMAL LOW (ref 22–51)

## 2015-05-08 LAB — URINALYSIS, ROUTINE W REFLEX MICROSCOPIC
Bilirubin Urine: NEGATIVE
Glucose, UA: NEGATIVE mg/dL
HGB URINE DIPSTICK: NEGATIVE
Ketones, ur: 15 mg/dL — AB
Nitrite: NEGATIVE
PH: 7 (ref 5.0–8.0)
Protein, ur: NEGATIVE mg/dL
SPECIFIC GRAVITY, URINE: 1.022 (ref 1.005–1.030)
Urobilinogen, UA: 1 mg/dL (ref 0.0–1.0)

## 2015-05-08 LAB — COMPREHENSIVE METABOLIC PANEL
ALT: 12 U/L — ABNORMAL LOW (ref 17–63)
ANION GAP: 10 (ref 5–15)
AST: 21 U/L (ref 15–41)
Albumin: 4.3 g/dL (ref 3.5–5.0)
Alkaline Phosphatase: 66 U/L (ref 38–126)
BUN: 6 mg/dL (ref 6–20)
CO2: 26 mmol/L (ref 22–32)
Calcium: 9.7 mg/dL (ref 8.9–10.3)
Chloride: 102 mmol/L (ref 101–111)
Creatinine, Ser: 0.83 mg/dL (ref 0.61–1.24)
GFR calc Af Amer: >60 mL/min (ref >60)
GFR calc non Af Amer: >60 mL/min (ref >60)
GLUCOSE: 87 mg/dL (ref 65–99)
POTASSIUM: 3.4 mmol/L — AB (ref 3.5–5.1)
SODIUM: 138 mmol/L (ref 135–145)
TOTAL PROTEIN: 7.9 g/dL (ref 6.5–8.1)
Total Bilirubin: 0.8 mg/dL (ref 0.3–1.2)

## 2015-05-08 LAB — CBC
HEMATOCRIT: 41.2 % (ref 39.0–52.0)
HEMOGLOBIN: 14.4 g/dL (ref 13.0–17.0)
MCH: 29.3 pg (ref 26.0–34.0)
MCHC: 35 g/dL (ref 30.0–36.0)
MCV: 83.9 fL (ref 78.0–100.0)
Platelets: 292 10*3/uL (ref 150–400)
RBC: 4.91 MIL/uL (ref 4.22–5.81)
RDW: 13 % (ref 11.5–15.5)
WBC: 10.4 10*3/uL (ref 4.0–10.5)

## 2015-05-08 LAB — I-STAT TROPONIN, ED: Troponin i, poc: 0 ng/mL (ref 0.00–0.08)

## 2015-05-08 LAB — URINE MICROSCOPIC-ADD ON

## 2015-05-08 MED ORDER — ONDANSETRON HCL 4 MG/2ML IJ SOLN: 4.0000 mg | Freq: Once | INTRAMUSCULAR | Status: DC | PRN

## 2015-05-08 MED ORDER — ONDANSETRON 4 MG PO TBDP
8.0000 mg | ORAL_TABLET | Freq: Once | ORAL | Status: AC
  Administered 2015-05-08: 8 mg via ORAL
  Filled 2015-05-08: qty 2

## 2015-05-08 MED ORDER — POTASSIUM CHLORIDE CRYS ER 20 MEQ PO TBCR
40.0000 meq | EXTENDED_RELEASE_TABLET | Freq: Once | ORAL | Status: AC
  Administered 2015-05-08: 40 meq via ORAL
  Filled 2015-05-08: qty 2

## 2015-05-08 NOTE — Discharge Instructions (Signed)
Stay away from all illegal drugs. Drink at least six 8 ounce glasses of water or Gatorade over the next 24 hours. Call any of the numbers on the resource guide to get a primary care physician. Ask your new primary care physician to help you to stop smoking. Return if you feel worse for any reason.  Emergency Department Resource Guide 1) Find a Doctor and Pay Out of Pocket Although you won't have to find out who is covered by your insurance plan, it is a good idea to ask around and get recommendations. You will then need to call the office and see if the doctor you have chosen will accept you as a new patient and what types of options they offer for patients who are self-pay. Some doctors offer discounts or will set up payment plans for their patients who do not have insurance, but you will need to ask so you aren't surprised when you get to your appointment.  2) Contact Your Local Health Department Not all health departments have doctors that can see patients for sick visits, but many do, so it is worth a call to see if yours does. If you don't know where your local health department is, you can check in your phone book. The CDC also has a tool to help you locate your state's health department, and many state websites also have listings of all of their local health departments.  3) Find a Walk-in Clinic If your illness is not likely to be very severe or complicated, you may want to try a walk in clinic. These are popping up all over the country in pharmacies, drugstores, and shopping centers. They're usually staffed by nurse practitioners or physician assistants that have been trained to treat common illnesses and complaints. They're usually fairly quick and inexpensive. However, if you have serious medical issues or chronic medical problems, these are probably not your best option.  No Primary Care Doctor: - Call Health Connect at  [phone] - they can help you locate a primary care doctor that  accepts  your insurance, provides certain services, etc. - Physician Referral Service- [phone]  Chronic Pain Problems: Organization         Address  Phone   Notes  [facility]  [phone] Patients need to be referred by their primary care doctor.   Medication Assistance: Organization         Address  Phone   Notes  [facility] [address] [place], [place] [address] [phone] --Must be a resident of [facility] -- Must have NO insurance coverage whatsoever (no Medicaid/ Medicare, etc.) -- The pt. MUST have a primary care doctor that directs their care regularly and follows them in the community   [facility]  [phone]   [employer]  [phone]    Agencies that provide inexpensive medical care: Organization         Address  Phone   Notes  [facility]  [phone]   [facility]    [phone]   [facility] [address] [place], [place] [address] [phone]   [facility] [address] [place] [place] [phone]   [facility]anned Parenthood    [phone]   [facility]    [phone]   [facility] and [facility]  [address], [place] Phone:  [phone], Fax:  [phone])  [phone] Hours of Operation:  9 am - 6 pm, M-F.  Also accepts Medicaid/Medicare and self-pay.  [facility]  [address], [place] Phone: [phone], Fax: [phone]. Hours of Operation:  8:30 am - 5:30 pm, M-F.  Also accepts Medicaid and self-pay.  [facility] [place] [address], [place] Phone: [phone]   [facility] [address] Natasha Bence [place], [place] [phone], Ext. [phone] Mondays & Thursdays: 7-9 AM.  First 15 patients are seen on a first come, first serve basis.    Medicaid-accepting [facility] Providers:  Organization          Address  Phone   Notes  [facility] [address], [place] [phone] Also accepts self-pay patients.  [facility] [address] Laurell Josephs [place], [place]  [phone]   [facility] [address], [place] [phone]   [facility] [address] [place] [phone]   Renaye Rakers [address], [place]   [phone] Only accepts [place] Access [place] patients after they have their name applied to their card.   Self-Pay (no insurance) in [facility]:  Organization         Address  Phone   Notes  Sickle Cell Patients, [facility] [address] [place], [place] [phone]   [facility] [address] [place], [place] [phone]   [facility]  [address], [place] [phone]   [facility]/Dr. Osei-Bonsu  [address] [place] or [address], [place] [phone] Phone number for both [place] and [place] locations is the same.  [facility] [address] [place] [phone]   [facility] [address] [place] or [address] [phone]   [facility] [address], [place] [phone], phone; [phone], fax Sees patients 1st and 3rd Saturday of every month.  Must not qualify for public or private insurance (i.e. Medicaid, Medicare, [place] Health Choice, Veterans' Benefits)  Household income should be no more than 200% of the poverty level The clinic cannot treat you if you are pregnant or think you are pregnant  Sexually transmitted diseases are not treated at the clinic.    Dental Care: Organization         Address  Phone  Notes  [facility] Department of [facility] [address] [place],  [place] [phone] Accepts children up to age 65 who are enrolled in [place] or [place] Health Choice; pregnant women with a Medicaid card; and children who have applied for Medicaid or [place] Health Choice, but were declined, whose parents can pay a reduced fee at time of service.  [facility] Department of [facility]  [address], [place] [phone] Accepts children up to age 3 who are enrolled in [place] or [place] Health Choice; pregnant women with a Medicaid card; and children who have applied for Medicaid or [place] Health Choice, but were declined, whose parents can pay a reduced fee at time of service.  [facility]  [address] [place], [place] [phone] Patients are seen by appointment only. Walk-ins are not accepted. [facility]  will see patients 22 years of age and older. Monday - Tuesday (8am-5pm) Most Wednesdays (8:30-5pm) $30 per visit, cash only  [facility]  [address], [facility] [phone] Patients are seen by appointment only. Walk-ins are not accepted. [facility] will see patients 22 years of age and older. One Wednesday Evening (Monthly: Volunteer Based).  $30 per visit, cash only  [employer] [facility]  [phone]) [phone] for adults; Children under age 724, call Graduate Pediatric Dentistry at [phone]. Children aged 654-14, please call [phone]) [phone] to request a pediatric application.  Dental services are provided in all areas of dental care including fillings, crowns and bridges, complete and partial dentures, implants, gum treatment, root canals, and extractions. Preventive care is also provided. Treatment is provided to both adults and children. Patients are selected via a lottery and there is often a waiting list.   [facility] [address], [place]  [phone] [email or web address]   [facility] [address], [place], [place]  [phone], Ext. [phone] Second and Fourth Thursday of each month, opens at 6:30 AM; Clinic ends at 9 AM.  Patients are seen on a first-come first-served basis, and a limited number are seen during each clinic.   [facility]  [address], [place], [place] [phone]   Eligibility Requirements You must have lived in [place], [place], or [place] counties for at least the last three months.   You cannot be eligible for state or federal sponsored [place] Cityhealthcare insurance, including CIGNAVeterans Administration, [place]icaid, or [employer]icare.   You generally cannot be eligible for healthcare insurance through your employer.    How to apply: Eligibility screenings are held every Tuesday and Wednesday afternoon from 1:00 pm until 4:00 pm. You do not need an appointment for the interview!  [facility] [address], [place], [place] [phone]   [facility]  [phone]   [facility]  [phone]   [facility]  [phone]    Behavioral Health Resources in the Community: Intensive Outpatient Programs Organization         Address  Phone  Notes  [facility] [address], [place], [place] [phone]   [facility] [address], [place], [place] [phone]   [facility] [address], [place], [place]  [phone]   [facility] [address],  [place], [place] [phone] or [phone]   Substance Abuse Resources Organization         Address  Phone  Notes  [facility]  [phone]   [facility]  [phone]   [facility]  [phone]   [facility]  [phone]   Residential & Outpatient Substance Abuse Program  [phone]   Psychological Services Organization         Address  Phone  Notes  [facility]  [phone]   [facility]  [phone]- [phone]    [facility] [address], [place] [phone] or [phone]    Mobile Crisis Teams Organization         Address  Phone  Notes  [facility], Mobile Crisis Care Unit  [phone]   [facility]  [address] [place], [place] [phone]   [facility] De[facility] [address] [employer] [facility] [place] [phone]    Self-Help/Support Groups Organization  Address  Phone             Notes  [place] of [place] - variety of support groups  [place] Call for more information  Narcotics Anonymous (NA), [facility] [address], [employer] [place]  2 meetings at this location   Special educational needs teacher         Address  Phone  Notes  [facility] [place],    [place]  [phone]   [facility]  [address], [place] [phone], [place], [place]   [place], [place] [phone] Admissions: 8am-3pm M-F  Incentives Substance [place] [address]    [place], [place] [phone]   [facility] [address] [place], [place], [place]   [facility] [address],  [place], [place]   [facility] - Intensive Outpatient [address] Kristeen [address], [place], [place]   [facility] [place].,  [place], [place] [phone] or [phone]   [facility] ([facility]) [address], [place], [place] Accepts Medicaid  [facility] [address],  [place] [phone] Substance Abuse/Addiction Treatment   [facility] Organization         Address  Phone  Notes  [facility]  [phone]   Domenic Schwab, PhD [address] Arlis Porta [place], [place]   [phone] or [phone]   Tyler  Ber[address] Howard [place], [place] [phone]   [facility] [address], [place], [place] [phone] Insurance/Medicaid/sponsorship through [facility] and Families [address], [address] [place]                                    [place], [place] [phone] [facility] [address], [place] [phone]    Dr. Adele Schilder  [phone]   [facility] Dept. 1) [address] [place] 2) [place] 3)  [place] [address], [place] [phone]  [phone]   [place] [phone] or [phone] (After Hours)

## 2015-05-08 NOTE — ED Provider Notes (Signed)
CSN: [id number]     Arrival date & time 05/08/15  1437 History   First MD Initiated Contact with Patient 05/08/15 1657     Chief Complaint  Patient presents with  . Palpitations  . Emesis     (Consider location/radiation/quality/duration/timing/severity/associated sxs/prior Treatment) HPI Patient reports he drank alcohol last night and took a "molly" in order to get high. He developed feeling of racing heart and vomited twice. He no longer feels as if his heart is racing. He does complain of mild nausea. No chest pain no headache no shortness of breath no other symptoms his symptoms have improved slowly with time. No treatment prior to coming here Past Medical History  Diagnosis Date  . Asthma    No past surgical history on file. No family history on file. Social History  Substance Use Topics  . Smoking status: Current Every Day Smoker -- 0.50 packs/day    Types: Cigarettes  . Smokeless tobacco: Not on file  . Alcohol Use: Yes    no IV drug use Review of Systems  Cardiovascular: Positive for palpitations.  Gastrointestinal: Positive for nausea and vomiting.  Neurological: Positive for light-headedness.  All other systems reviewed and are negative.     Allergies  Review of patient's allergies indicates no known allergies.  Home Medications   Prior to Admission medications   Medication Sig Start Date End Date Taking? Authorizing Provider  promethazine (PHENERGAN) 25 MG tablet Take 1 tablet (25 mg total) by mouth every 8 (eight) hours as needed for nausea or vomiting. 01/19/15   Charlestine Nighthristopher Lawyer, PA-C   BP 117/74 mmHg  Pulse 90  Temp(Src) 99.3 F (37.4 C) (Oral)  Resp 18  SpO2 99% Physical Exam  Constitutional: He appears well-developed and well-nourished.  HENT:  Head: Normocephalic and atraumatic.  Eyes: Conjunctivae are normal. Pupils are equal, round, and reactive to light.  Neck: Neck supple. No tracheal deviation present. No thyromegaly present.   Cardiovascular: Normal rate and regular rhythm.   No murmur heard. Pulmonary/Chest: Effort normal and breath sounds normal.  Abdominal: Soft. Bowel sounds are normal. He exhibits no distension. There is no tenderness.  Musculoskeletal: Normal range of motion. He exhibits no edema or tenderness.  Neurological: He is alert. Coordination normal.  Skin: Skin is warm and dry. No rash noted.  Psychiatric: He has a normal mood and affect.  Nursing note and vitals reviewed.   ED Course  Procedures (including critical care time) Labs Review Labs Reviewed  LIPASE, BLOOD - Abnormal; Notable for the following:    Lipase 19 (*)    All other components within normal limits  COMPREHENSIVE METABOLIC PANEL - Abnormal; Notable for the following:    Potassium 3.4 (*)    ALT 12 (*)    All other components within normal limits  CBC  URINALYSIS, ROUTINE W REFLEX MICROSCOPIC (NOT AT [facility])  I-STAT TROPOININ, ED    Imaging Review No results found. I have personally reviewed and evaluated these images and lab results as part of my medical decision-making.   EKG Interpretation   Date/Time:  Thursday May 08 2015 14:53:14 EDT Ventricular Rate:  93 PR Interval:  128 QRS Duration: 82 QT Interval:  366 QTC Calculation: 455 R Axis:   56 Text Interpretation:  Normal sinus rhythm Normal ECG No significant change  since last tracing Confirmed by Ethelda ChickJACUBOWITZ  MD, Hulan Szumski [phone]([id number]) on 05/08/2015  5:04:40 PM      Results for orders placed or performed during the hospital encounter of  05/08/15  Lipase, blood  Result Value Ref Range   Lipase 19 (L) 22 - 51 U/L  Comprehensive metabolic panel  Result Value Ref Range   Sodium 138 135 - 145 mmol/L   Potassium 3.4 (L) 3.5 - 5.1 mmol/L   Chloride 102 101 - 111 mmol/L   CO2 26 22 - 32 mmol/L   Glucose, Bld 87 65 - 99 mg/dL   BUN 6 6 - 20 mg/dL   Creatinine, Ser 1.61 0.61 - 1.24 mg/dL   Calcium 9.7 8.9 - 09.6 mg/dL   Total Protein 7.9 6.5 - 8.1 g/dL    Albumin 4.3 3.5 - 5.0 g/dL   AST 21 15 - 41 U/L   ALT 12 (L) 17 - 63 U/L   Alkaline Phosphatase 66 38 - 126 U/L   Total Bilirubin 0.8 0.3 - 1.2 mg/dL   GFR calc non Af Amer >60 >60 mL/min   GFR calc Af Amer >60 >60 mL/min   Anion gap 10 5 - 15  CBC  Result Value Ref Range   WBC 10.4 4.0 - 10.5 K/uL   RBC 4.91 4.22 - 5.81 MIL/uL   Hemoglobin 14.4 13.0 - 17.0 g/dL   HCT 04.5 40.9 - 81.1 %   MCV 83.9 78.0 - 100.0 fL   MCH 29.3 26.0 - 34.0 pg   MCHC 35.0 30.0 - 36.0 g/dL   RDW 91.4 78.2 - 95.6 %   Platelets 292 150 - 400 K/uL  I-Stat Troponin, ED (not at [facility])  Result Value Ref Range   Troponin i, poc 0.00 0.00 - 0.08 ng/mL   Comment 3           No results found.  5:30 PM patient is asymptomatic after treatment with oral Zofran. He is able to drink without difficulty. MDM  I counseled patient for 5 minutes on smoking cessation. Encouraged oral hydration. Referral resource guide to get primary care physician. Encourage him to stay away from illegal substance. Diagnoses #1 substance abuse #2 palpitations #3 nausea and vomiting #4 mild hypokalemia #5 tobacco abuse Final diagnoses:  None        Doug Sou, MD 05/08/15 1738

## 2015-05-08 NOTE — ED Notes (Signed)
Pt reports was partying last night, used molly, smoked marijuana, drinking. PT reports palpitations, lightheaded, generalized weakness. Vomited overnight. PT awake, alert, oriented x4, VSS. EKG done in triage.

## 2015-07-06 ENCOUNTER — Encounter (HOSPITAL_COMMUNITY): Payer: Self-pay

## 2015-07-06 ENCOUNTER — Emergency Department (HOSPITAL_COMMUNITY)
Admission: EM | Admit: 2015-07-06 | Discharge: 2015-07-06 | Disposition: A | Discharge status: Home / Self Care | Payer: Self-pay | Attending: Emergency Medicine | Admitting: Emergency Medicine

## 2015-07-06 DIAGNOSIS — J45909 Unspecified asthma, uncomplicated: Secondary | ICD-10-CM | POA: Insufficient documentation

## 2015-07-06 DIAGNOSIS — Z202 Contact with and (suspected) exposure to infections with a predominantly sexual mode of transmission: Secondary | ICD-10-CM | POA: Insufficient documentation

## 2015-07-06 DIAGNOSIS — F17210 Nicotine dependence, cigarettes, uncomplicated: Secondary | ICD-10-CM | POA: Insufficient documentation

## 2015-07-06 MED ORDER — AZITHROMYCIN 250 MG PO TABS
1000.0000 mg | ORAL_TABLET | Freq: Once | ORAL | Status: AC
  Administered 2015-07-06: 1000 mg via ORAL
  Filled 2015-07-06: qty 4

## 2015-07-06 MED ORDER — CEFTRIAXONE SODIUM 250 MG IJ SOLR
250.0000 mg | Freq: Once | INTRAMUSCULAR | Status: AC
  Administered 2015-07-06: 250 mg via INTRAMUSCULAR
  Filled 2015-07-06: qty 250

## 2015-07-06 MED ORDER — LIDOCAINE HCL (PF) 1 % IJ SOLN
0.9000 mL | Freq: Once | INTRAMUSCULAR | Status: AC
  Administered 2015-07-06: 0.9 mL
  Filled 2015-07-06: qty 5

## 2015-07-06 NOTE — Discharge Instructions (Signed)
You were not tested for all STDs today. Your gonorrhea and chlamydia tests are pending- if they are positive, you will receive a phone call. Refrain from sex until you have the results from a full STD screen. Please go to [facility] (Address: [address], [place], [place] [address] Phone: [phone]) or see the [facility] (Address: [address] Phone: [phone]) for full STD screening. Return to the emergency room for worsening of symptoms, fever, and vomiting.   Do not hesitate to return to the emergency room for any new, worsening or concerning symptoms.  Please obtain primary care using resource guide below. Let them know that you were seen in the emergency room and that they will need to obtain records for further outpatient management.   Sexually Transmitted Disease A sexually transmitted disease (STD) is a disease or infection that may be passed (transmitted) from person to person, usually during sexual activity. This may happen by way of saliva, semen, blood, vaginal mucus, or urine. Common STDs include:  Gonorrhea.  Chlamydia.  Syphilis.  HIV and AIDS.  Genital herpes.  Hepatitis B and C.  Trichomonas.  Human papillomavirus (HPV).  Pubic lice.  Scabies.  Mites.  Bacterial vaginosis. WHAT ARE CAUSES OF STDs? An STD may be caused by bacteria, a virus, or parasites. STDs are often transmitted during sexual activity if one person is infected. However, they may also be transmitted through nonsexual means. STDs may be transmitted after:   Sexual intercourse with an infected person.  Sharing sex toys with an infected person.  Sharing needles with an infected person or using unclean piercing or tattoo needles.  Having intimate contact with the genitals, mouth, or rectal areas of an infected person.  Exposure to infected fluids during birth. WHAT ARE THE SIGNS AND SYMPTOMS OF STDs? Different STDs have different symptoms. Some  people may not have any symptoms. If symptoms are present, they may include:  Painful or bloody urination.  Pain in the pelvis, abdomen, vagina, anus, throat, or eyes.  A skin rash, itching, or irritation.  Growths, ulcerations, blisters, or sores in the genital and anal areas.  Abnormal vaginal discharge with or without bad odor.  Penile discharge in men.  Fever.  Pain or bleeding during sexual intercourse.  Swollen glands in the groin area.  Yellow skin and eyes (jaundice). This is seen with hepatitis.  Swollen testicles.  Infertility.  Sores and blisters in the mouth. HOW ARE STDs DIAGNOSED? To make a diagnosis, your health care provider may:  Take a medical history.  Perform a physical exam.  Take a sample of any discharge to examine.  Swab the throat, cervix, opening to the penis, rectum, or vagina for testing.  Test a sample of your first morning urine.  Perform blood tests.  Perform a Pap test, if this applies.  Perform a colposcopy.  Perform a laparoscopy. HOW ARE STDs TREATED? Treatment depends on the STD. Some STDs may be treated but not cured.  Chlamydia, gonorrhea, trichomonas, and syphilis can be cured with antibiotic medicine.  Genital herpes, hepatitis, and HIV can be treated, but not cured, with prescribed medicines. The medicines lessen symptoms.  Genital warts from HPV can be treated with medicine or by freezing, burning (electrocautery), or surgery. Warts may come back.  HPV cannot be cured with medicine or surgery. However, abnormal areas may be removed from the cervix, vagina, or vulva.  If your diagnosis is confirmed, your recent sexual partners need treatment. This is true  even if they are symptom-free or have a negative culture or evaluation. They should not have sex until their health care providers say it is okay.  Your health care provider may test you for infection again 3 months after treatment. HOW CAN I REDUCE MY RISK OF  GETTING AN STD? Take these steps to reduce your risk of getting an STD:  Use latex condoms, dental dams, and water-soluble lubricants during sexual activity. Do not use petroleum jelly or oils.  Avoid having multiple sex partners.  Do not have sex with someone who has other sex partners  Do not have sex with anyone you do not know or who is at high risk for an STD.  Avoid risky sex practices that can break your skin.  Do not have sex if you have open sores on your mouth or skin.  Avoid drinking too much alcohol or taking illegal drugs. Alcohol and drugs can affect your judgment and put you in a vulnerable position.  Avoid engaging in oral and anal sex acts.  Get vaccinated for HPV and hepatitis. If you have not received these vaccines in the past, talk to your health care provider about whether one or both might be right for you.  If you are at risk of being infected with HIV, it is recommended that you take a prescription medicine daily to prevent HIV infection. This is called pre-exposure prophylaxis (PrEP). You are considered at risk if:  You are a man who has sex with other men (MSM).  You are a heterosexual man or woman and are sexually active with more than one partner.  You take drugs by injection.  You are sexually active with a partner who has HIV.  Talk with your health care provider about whether you are at high risk of being infected with HIV. If you choose to begin PrEP, you should first be tested for HIV. You should then be tested every 3 months for as long as you are taking PrEP. WHAT SHOULD I DO IF I THINK I HAVE AN STD?  See your health care provider.  Tell your sexual partner(s). They should be tested and treated for any STDs.  Do not have sex until your health care provider says it is okay. WHEN SHOULD I GET IMMEDIATE MEDICAL CARE? Contact your health care provider right away if:   You have severe abdominal pain.  You are a man and notice swelling or  pain in your testicles.  You are a woman and notice swelling or pain in your vagina.   This information is not intended to replace advice given to you by your health care provider. Make sure you discuss any questions you have with your health care provider.   Document Released: 10/02/2002 Document Revised: 08/02/2014 Document Reviewed: 01/30/2013 Elsevier Interactive Patient Education 2016 [employer].   Emergency Department Resource Guide 1) Find a Doctor and Pay Out of Pocket Although you won't have to find out who is covered by your insurance plan, it is a good idea to ask around and get recommendations. You will then need to call the office and see if the doctor you have chosen will accept you as a new patient and what types of options they offer for patients who are self-pay. Some doctors offer discounts or will set up payment plans for their patients who do not have insurance, but you will need to ask so you aren't surprised when you get to your appointment.  2) Contact Your Local Health  Department Not all health departments have doctors that can see patients for sick visits, but many do, so it is worth a call to see if yours does. If you don't know where your local health department is, you can check in your phone book. The CDC also has a tool to help you locate your state's health department, and many state websites also have listings of all of their local health departments.  3) Find a Walk-in Clinic If your illness is not likely to be very severe or complicated, you may want to try a walk in clinic. These are popping up all over the country in pharmacies, drugstores, and shopping centers. They're usually staffed by nurse practitioners or physician assistants that have been trained to treat common illnesses and complaints. They're usually fairly quick and inexpensive. However, if you have serious medical issues or chronic medical problems, these are probably not your best option.  No  Primary Care Doctor: - Call Health Connect at  [phone] - they can help you locate a primary care doctor that  accepts your insurance, provides certain services, etc. - Physician Referral Service- [phone]  Chronic Pain Problems: Organization         Address  Phone   Notes  [facility]  [phone] Patients need to be referred by their primary care doctor.   Medication Assistance: Organization         Address  Phone   Notes  [facility] Medication [facility] [address] [place] [place], [place] [address] [phone] --Must be a resident of [facility] -- Must have NO insurance coverage whatsoever (no Medicaid/ Medicare, etc.) -- The pt. MUST have a primary care doctor that directs their care regularly and follows them in the community   Med[facility]ist  [phone]   [employer]  [phone]    Agencies that provide inexpensive medical care: Organization         Address  Phone   Notes  [facility]  [phone]   [facility]    [phone]   [facility] [address] [place], [place] [address] [phone]   [facility] [address], [place] [phone]   [facility]    [phone]   [facility]    [phone]   [facility] and [facility]  [address], [place] Phone:  [phone], Fax:  [phone] Hours of Operation:  9 am - 6 pm, M-F.  Also accepts Medicaid/Medicare and self-pay.  [facility]  [address], [place] Phone: [phone], Fax: [phone]. Hours of Operation:  8:30 am - 5:30 pm, M-F.  Also accepts Medicaid and self-pay.  [facility] [place] [address] [place] Phone: [phone]   [facility] [address] [place] [address] Natasha Bence [place], [place] [phone], Ext. [phone] Mondays & Thursdays: 7-9 AM.  First 15 patients are seen on  a first come, first serve basis.    Medicaid-accepting [facility] Providers:  Organization         Address  Phone   Notes  [facility] [address] [place] [address], [place] [phone] Also accepts self-pay patients.  [facility] [address] Laurell Josephs [place], [place]  [phone]   [facility] [address], [place] [phone]  [facility] [address] [place] [phone]   Renaye Rakers [address], [place]   [phone] Only accepts [place] Access [place] patients after they have their name applied to their card.   Self-Pay (no insurance) in [facility]:  Organization         Address  Phone   Notes  Sickle Cell Patients, [facility] Medicine [address] [place], [place] [phone]   [facility] [address] [place], [place] [phone]   [facility] [place]  [address], [place] [phone]   [facility]/Dr. Osei-Bonsu  [address], [place] or [address], [place] [phone] Phone number for both [place] and [place] locations is the same.  [facility]gent Medical and [facility] [address] [place] [phone]   [facility] [address] [place] or [address] [phone]   [facility] [address], [place] [phone], phone; [phone], fax Sees patients 1st and 3rd Saturday of every month.  Must not qualify for public or private insurance (i.e. Medicaid, Medicare, [place] Health Choice, Veterans' Benefits)  Household income should be no more than 200% of the poverty level The clinic cannot treat you if you are pregnant or think you are pregnant  Sexually transmitted diseases are not treated at the clinic.    Dental Care: Organization          Address  Phone  Notes  [facility] Department of [facility] 2 [facility] [address] [place], [place] [phone] Accepts children up to age 9 who are enrolled in [place] or [place] Health Choice; pregnant women with a Medicaid card; and children who have applied for Medicaid or [place] Health Choice, but were declined, whose parents can pay a reduced fee at time of service.  [facility] Department of [facility]  [address], [place] [phone] Accepts children up to age 46 who are enrolled in [place] or [place] Health Choice; pregnant women with a Medicaid card; and children who have applied for Medicaid or  Health Choice, but were declined, whose parents can pay a reduced fee at time of service.  [facility]  [address] [place], [place] [phone] Patients are seen by appointment only. Walk-ins are not accepted. [facility] will see patients 94 years of age and older. Monday - Tuesday (8am-5pm) Most Wednesdays (8:30-5pm) $30 per visit, cash only  [facility] Adult Dental Access PRO[facility]  [address], [facility] [phone] Patients are seen by appointment only. Walk-ins are not accepted. [facility] will see patients 33 years of age and older. One Wednesday Evening (Monthly: Volunteer Based).  $30 per visit, cash only  [employer]  [phone] for adults; Children under age 37, call Graduate Pediatric Dentistry at [phone]. Children aged 43-14, please call [phone] to request a pediatric application.  Dental services are provided in all areas of dental care including fillings, crowns and bridges, complete and partial dentures, implants, gum treatment, root canals, and extractions. Preventive care is also provided. Treatment is provided to both adults and children. Patients are selected via a lottery and there is often a waiting list.   [facility] [address] [place]  [phone] [email or web address]  [facility] [address] [place], [place] [phone], Ext. [phone] Second and Fourth Thursday of each month, opens at 6:30 AM; Clinic ends at 9 AM.  Patients are seen on a first-come first-served basis, and a limited number are seen during each clinic.   [facility]  [address] Ether Griffins [place], [place] [phone]   Eligibility Requirements You must have lived in [place], [place], or [place] counties for at least the last three months.   You cannot be eligible for state or federal sponsored [place], including [employer], [place], or [employer].   You generally cannot be eligible for healthcare insurance through your employer.    How to apply: Eligibility screenings are held every Tuesday and Wednesday afternoon from 1:00 pm until 4:00 pm. You do not need an appointment for the interview!  [facility] [address], [place], [place] [phone]   [facility] Health Department  [phone]   [facility] Health Department  [phone]   [facility] Health Department  [phone]    Behavioral Health Resources in the Community: Intensive Outpatient Programs Organization         Address  Phone  Notes  [facility] [address], [place], [place] [phone]   [facility] Outpatient [address], [place], [place] [phone]   [facility] [address], [place], [place]  [phone]   [facility] [address],  [place], [place] [phone] or [phone]   Substance Abuse Resources Organization         Address  Phone  Notes  [facility]  [phone]   [facility]  [phone]   [facility] [place]  [phone]   Floydene Flock  [phone]   Residential & Outpatient Substance Abuse Program  [phone]   Psychological  Services Organization         Address  Phone  Notes  [facility]  [phone]   [facility]  [phone]   [facility] [address] [place] [phone] or [phone]    Mobile Crisis Teams Organization         Address  Phone  Notes  [facility], Mobile Crisis Care Unit  [phone]   [facility]  [address] [place], [place] [phone]   Doristine Locks [address] [place] [phone]    Self-Help/Support Groups Organization         Address  Phone             Notes  [facility] [place] - variety of support groups  336- I7437963 Call for more information  Narcotics Anonymous (NA), [facility] [address], [employer] [place]  2 meetings at this location   Statistician         Address  Phone  Notes  [facility] [address],    [place]  [phone]   [facility]  [address], [place] [address], [place], [place] [phone]   [facility] [address] [place], [place] [phone] Admissions: 8am-3pm M-F  [facility] [address],    [place], [place] [phone]   [facility] [address] Starling Manns [place], [place] [phone]   [facility] [address]  [place], [place] [phone]   [facility] - Intensive Outpatient [phone]  [address], [place], [place] [phone]   [facility] ([facility]iction [facility] [address] [place].,  [place], [place] [phone] or [phone]   [facility] ([facility]) [address], [place], [place] [phone] Accepts Medicaid  [facility] [address],  [place] [phone] Substance Abuse/Addiction Treatment   [facility] Organization         Address  Phone  Notes  [facility]  [phone]   Angie Fava, PhD [address] Ervin Knack [place], [place]   [phone] or [phone]   [facility] Behavioral   [address] [place], [place] [phone]   [facility] [address], [place], [place] [phone] Insurance/Medicaid/sponsorship through [facility] and Families [address]                                    [place], [place] [phone] Therapy/tele-psych/case  [facility] [address] [address], [place] [phone]    Dr. Lolly Mustache  [phone]   [facility]  [employer] [facility] Dept. 1) [address] [place] 2) [address] [place] 3)  [address], [place] [phone]  [phone]   [facility] Child Abuse Hotline [phone] or [phone] (After Hours)

## 2015-07-06 NOTE — ED Provider Notes (Signed)
CSN: [id number]     Arrival date & time 07/06/15  1258 History  By signing my name below, I, Elon Spanner, attest that this documentation has been prepared under the direction and in the presence of [employer], PA-C. Electronically Signed: Elon Spanner ED Scribe. 07/06/2015. 1:46 PM.    Chief Complaint  Patient presents with  . SEXUALLY TRANSMITTED DISEASE   The history is provided by the patient. No language interpreter was used.   HPI Comments: Vincent West is a 24 y.o. male who presents to the Emergency Department for an STD check.  He reports having unprotected sex with is girlfriend who was recently dx'd with chlamydia.  He is unaware if she was positive for other STD's.  He denies dysuria, penile discharge, abdominal pain, rash, testicular pain/swelling.   Past Medical History  Diagnosis Date  . Asthma    History reviewed. No pertinent past surgical history. History reviewed. No pertinent family history. Social History  Substance Use Topics  . Smoking status: Current Every Day Smoker -- 0.50 packs/day    Types: Cigarettes  . Smokeless tobacco: None  . Alcohol Use: Yes     Comment: occ     Review of Systems A complete 10 system review of systems was obtained and all systems are negative except as noted in the HPI and PMH.   Allergies  Review of patient's allergies indicates no known allergies.  Home Medications   Prior to Admission medications   Medication Sig Start Date End Date Taking? Authorizing Provider  promethazine (PHENERGAN) 25 MG tablet Take 1 tablet (25 mg total) by mouth every 8 (eight) hours as needed for nausea or vomiting. 01/19/15   Charlestine Night, PA-C   BP 116/67 mmHg  Pulse 70  Temp(Src) 97.4 F (36.3 C) (Oral)  Resp 18  Ht  (1.651 m)  Wt 152 lb (68.947 kg)  BMI 25.29 kg/m2  SpO2 100% Physical Exam  Constitutional: He is oriented to person, place, and time. He appears well-developed and well-nourished. No distress.  HENT:   Head: Normocephalic and atraumatic.  Eyes: Conjunctivae and EOM are normal.  Neck: Neck supple. No tracheal deviation present.  Cardiovascular: Normal rate.   Pulmonary/Chest: Effort normal. No respiratory distress.  Genitourinary:  GU exam is chaperoned by nurse Becky: No rashes or lesions, no testicular pain or swelling, no urethral discharge.   Musculoskeletal: Normal range of motion.  Neurological: He is alert and oriented to person, place, and time.  Skin: Skin is warm and dry.  Psychiatric: He has a normal mood and affect. His behavior is normal.  Nursing note and vitals reviewed.   ED Course  Procedures (including critical care time)  DIAGNOSTIC STUDIES: Oxygen Saturation is 100% on RA, normal by my interpretation.    COORDINATION OF CARE:  1:45 PM Discussed plan to perform comprehensive STD screening which patient agreed to.  Will order antibiotics.  Patient acknowledges and agrees with plan.    Labs Review Labs Reviewed - No data to display  Imaging Review No results found. I have personally reviewed and evaluated these images and lab results as part of my medical decision-making.   EKG Interpretation None      MDM   Final diagnoses:  Exposure to STD    Filed Vitals:   07/06/15 1314  BP: 116/67  Pulse: 70  Temp: 97.4 F (36.3 C)  TempSrc: Oral  Resp: 18  Height:  (1.651 m)  Weight: 68.947 kg  SpO2: 100%  Medications  cefTRIAXone (ROCEPHIN) injection 250 mg (250 mg Intramuscular Given 07/06/15 1415)  azithromycin (ZITHROMAX) tablet 1,000 mg (1,000 mg Oral Given 07/06/15 1414)  lidocaine (PF) (XYLOCAINE) 1 % injection 0.9 mL (0.9 mLs Other Given 07/06/15 1416)    Vincent West is 24 y.o. male requesting treatment, girlfriend was recently diagnosed with chlamydia. Patient is asymptomatic. Treated for chlamydia and gonorrhea, full STD pending to be followed up by infectious disease. We've had an extensive discussion on safe sex until he has  the full STD screening results. Patient verbalizes understanding.  Evaluation does not show pathology that would require ongoing emergent intervention or inpatient treatment. Pt is hemodynamically stable and mentating appropriately. Discussed findings and plan with patient/guardian, who agrees with care plan. All questions answered. Return precautions discussed and outpatient follow up given.    I personally performed the services described in this documentation, which was scribed in my presence. The recorded information has been reviewed and is accurate.    Wynetta Emeryicole Zinia Innocent, PA-C 07/06/15 1431  Cathren LaineKevin Steinl, MD 07/06/15 [phone]

## 2015-07-06 NOTE — ED Notes (Signed)
Pt reports [facility] is [facility] and was [facility].  Needs treated.  No symptoms.

## 2015-07-07 LAB — GC/CHLAMYDIA PROBE AMP (~~LOC~~) NOT AT ARMC
Chlamydia: POSITIVE — AB
Neisseria Gonorrhea: NEGATIVE

## 2015-07-07 LAB — RPR: RPR: NONREACTIVE

## 2015-07-07 LAB — HIV ANTIBODY (ROUTINE TESTING W REFLEX): HIV Screen 4th Generation wRfx: NONREACTIVE

## 2015-07-08 ENCOUNTER — Telehealth (HOSPITAL_COMMUNITY): Payer: Self-pay

## 2015-07-08 NOTE — Telephone Encounter (Signed)
Spoke with pt. Verified ID. Informed of labs. Treated per protocol. [employer] form faxed. Pt informed to abstain from sexual activity x 10 days and to notify partner for testing and treatment.  

## 2015-08-01 ENCOUNTER — Emergency Department (HOSPITAL_COMMUNITY)
Admission: EM | Admit: 2015-08-01 | Discharge: 2015-08-01 | Disposition: A | Discharge status: Home / Self Care | Payer: Self-pay | Attending: Physician Assistant | Admitting: Physician Assistant

## 2015-08-01 ENCOUNTER — Encounter (HOSPITAL_COMMUNITY): Payer: Self-pay | Admitting: Emergency Medicine

## 2015-08-01 DIAGNOSIS — B001 Herpesviral vesicular dermatitis: Secondary | ICD-10-CM | POA: Insufficient documentation

## 2015-08-01 DIAGNOSIS — J45909 Unspecified asthma, uncomplicated: Secondary | ICD-10-CM | POA: Insufficient documentation

## 2015-08-01 DIAGNOSIS — F17210 Nicotine dependence, cigarettes, uncomplicated: Secondary | ICD-10-CM | POA: Insufficient documentation

## 2015-08-01 NOTE — ED Provider Notes (Signed)
CSN: [id number]     Arrival date & time 08/01/15  1401 History  By signing my name below, I, Essence Howell, attest that this documentation has been prepared under the direction and in the presence of Roxy Horsemanobert Siobhan Zaro, PA-C Electronically Signed: Charline BillsEssence Howell, ED Scribe 08/01/2015 at 2:37 PM.   Chief Complaint  Patient presents with  . Oral Swelling   The history is provided by the patient. No language interpreter was used.   HPI Comments: Vincent West is a 25 y.o. male who presents to the Emergency Department with a chief complaint of a tender lesion to the left lower lip first noticed yesterday. Pt states that he noticed a small bump to his left lower lip yesterday and applied ointment to the area which contained acid and burned his lip. He reports mild pain to the area and swollen lymph node under his chin since yesterday. He has tried antibiotic ointment and 1 of his grandmother's antibiotics which he suspects was penicillin without relief. Pt denies h/o cold sores.   Past Medical History  Diagnosis Date  . Asthma    No past surgical history on file. No family history on file. Social History  Substance Use Topics  . Smoking status: Current Every Day Smoker -- 0.50 packs/day    Types: Cigarettes  . Smokeless tobacco: Not on file  . Alcohol Use: Yes     Comment: occ     Review of Systems  Constitutional: Negative for fever and chills.  HENT: Positive for mouth sores.   Respiratory: Negative for shortness of breath.   Cardiovascular: Negative for chest pain.  Gastrointestinal: Negative for nausea, vomiting, diarrhea and constipation.  Genitourinary: Negative for dysuria.   Allergies  Review of patient's allergies indicates no known allergies.  Home Medications   Prior to Admission medications   Medication Sig Start Date End Date Taking? Authorizing Provider  promethazine (PHENERGAN) 25 MG tablet Take 1 tablet (25 mg total) by mouth every 8 (eight) hours as needed for  nausea or vomiting. 01/19/15   Charlestine Nighthristopher Lawyer, PA-C   BP 124/83 mmHg  Pulse 89  Temp(Src) 98.3 F (36.8 C) (Oral)  Resp 18  SpO2 100% Physical Exam  Constitutional: He is oriented to person, place, and time. He appears well-developed and well-nourished. No distress.  HENT:  Head: Normocephalic and atraumatic.  Eyes: Conjunctivae and EOM are normal.  Neck: Neck supple. No tracheal deviation present.  Cardiovascular: Normal rate.   Pulmonary/Chest: Effort normal. No respiratory distress.  Musculoskeletal: Normal range of motion.  Lymphadenopathy:       Head (left side): Submental adenopathy present.  Neurological: He is alert and oriented to person, place, and time.  Skin: Skin is warm and dry.  Mild macular papular lesion to L lower lip. No sign of abscess or cellulitis. Possible cold sore.   Psychiatric: He has a normal mood and affect. His behavior is normal.  Nursing note and vitals reviewed.  ED Course  Procedures (including critical care time) DIAGNOSTIC STUDIES: Oxygen Saturation is 100% on RA, normal by my interpretation.    COORDINATION OF CARE: 2:23 PM-Discussed treatment plan with pt at bedside and pt agreed to plan.   Labs Review Labs Reviewed - No data to display  Imaging Review No results found.   EKG Interpretation None      MDM   Final diagnoses:  Cold sore    Patient with HSV.  Recommend OTC meds.  PCP follow-up.  Seen by and discussed with Dr. Corlis LeakMackuen.  I personally performed the services described in this documentation, which was scribed in my presence. The recorded information has been reviewed and is accurate.      Roxy Horseman, PA-C 08/01/15 1524  Courteney Randall An, MD 08/02/15 [phone]

## 2015-08-01 NOTE — ED Notes (Addendum)
Sore on left, lower lip. Also has swollen, tender, firm lymph node under chin.

## 2015-08-01 NOTE — Discharge Instructions (Signed)
Cold Sore A cold sore (fever blister) is a skin infection caused by the herpes simplex virus (HSV-1). HSV-1 is closely related to the virus that causes genital herpes (HSV-2), but they are not the same even though both viruses can cause oral and genital infections. Cold sores are small, fluid-filled sores inside of the mouth or on the lips, gums, nose, chin, cheeks, or fingers.  The herpes simplex virus can be easily passed (contagious) to other people through close personal contact, such as kissing or sharing personal items. The virus can also spread to other parts of the body, such as the eyes or genitals. Cold sores are contagious until the sores crust over completely. They often heal within 2 weeks.  Once a person is infected, the herpes simplex virus remains permanently in the body. Therefore, there is no cure for cold sores, and they often recur when a person is tired, stressed, sick, or gets too much sun. Additional factors that can cause a recurrence include hormone changes in menstruation or pregnancy, certain drugs, and cold weather.  CAUSES  Cold sores are caused by the herpes simplex virus. The virus is spread from person to person through close contact, such as through kissing, touching the affected area, or sharing personal items such as lip balm, razors, or eating utensils.  SYMPTOMS  The first infection may not cause symptoms. If symptoms develop, the symptoms often go through different stages. Here is how a cold sore develops:   Tingling, itching, or burning is felt 1-2 days before the outbreak.   Fluid-filled blisters appear on the lips, inside the mouth, nose, or on the cheeks.   The blisters start to ooze clear fluid.   The blisters dry up and a yellow crust appears in its place.   The crust falls off.  Symptoms depend on whether it is the initial outbreak or a recurrence. Some other symptoms with the first outbreak may include:   Fever.   Sore throat.   Headache.    Muscle aches.   Swollen neck glands.  DIAGNOSIS  A diagnosis is often made based on your symptoms and looking at the sores. Sometimes, a sore may be swabbed and then examined in the lab to make a final diagnosis. If the sores are not present, blood tests can find the herpes simplex virus.  TREATMENT  There is no cure for cold sores and no vaccine for the herpes simplex virus. Within 2 weeks, most cold sores go away on their own without treatment. Medicines cannot make the infection go away, but medicine can help relieve some of the pain associated with the sores, can work to stop the virus from multiplying, and can also shorten healing time. Medicine may be in the form of creams, gels, pills, or a shot.  HOME CARE INSTRUCTIONS   Only take over-the-counter or prescription medicines for pain, discomfort, or fever as directed by your caregiver. Do not use aspirin.   Use a cotton-tip swab to apply creams or gels to your sores.   Do not touch the sores or pick the scabs. Wash your hands often. Do not touch your eyes without washing your hands first.   Avoid kissing, oral sex, and sharing personal items until sores heal.   Apply an ice pack on your sores for 10-15 minutes to ease any discomfort.   Avoid hot, cold, or salty foods because they may hurt your mouth. Eat a soft, bland diet to avoid irritating the sores. Use a straw to drink   if you have pain when drinking out of a glass.   Keep sores clean and dry to prevent an infection of other tissues.   Avoid the sun and limit stress if these things trigger outbreaks. If sun causes cold sores, apply sunscreen on the lips before being out in the sun.  SEEK MEDICAL CARE IF:   You have a fever or persistent symptoms for more than 2-3 days.   You have a fever and your symptoms suddenly get worse.   You have pus, not clear fluid, coming from the sores.   You have redness that is spreading.   You have pain or irritation in your  eye.   You get sores on your genitals.   Your sores do not heal within 2 weeks.   You have a weakened immune system.   You have frequent recurrences of cold sores.  MAKE SURE YOU:   Understand these instructions.  Will watch your condition.  Will get help right away if you are not doing well or get worse.   This information is not intended to replace advice given to you by your health care provider. Make sure you discuss any questions you have with your health care provider.   Document Released: 07/09/2000 Document Revised: 08/02/2014 Document Reviewed: 11/24/2011 Elsevier Interactive Patient Education 2016 Elsevier Inc.  

## 2015-09-04 ENCOUNTER — Encounter (HOSPITAL_COMMUNITY): Payer: Self-pay | Admitting: Emergency Medicine

## 2015-09-04 ENCOUNTER — Emergency Department (HOSPITAL_COMMUNITY): Payer: Self-pay

## 2015-09-04 ENCOUNTER — Emergency Department (HOSPITAL_COMMUNITY)
Admission: EM | Admit: 2015-09-04 | Discharge: 2015-09-04 | Disposition: A | Discharge status: Home / Self Care | Payer: Self-pay | Attending: Emergency Medicine | Admitting: Emergency Medicine

## 2015-09-04 DIAGNOSIS — F17210 Nicotine dependence, cigarettes, uncomplicated: Secondary | ICD-10-CM | POA: Insufficient documentation

## 2015-09-04 DIAGNOSIS — B349 Viral infection, unspecified: Secondary | ICD-10-CM | POA: Insufficient documentation

## 2015-09-04 DIAGNOSIS — J45909 Unspecified asthma, uncomplicated: Secondary | ICD-10-CM | POA: Insufficient documentation

## 2015-09-04 LAB — I-STAT CHEM 8, ED
BUN: 6 mg/dL (ref 6–20)
CALCIUM ION: 1.19 mmol/L (ref 1.12–1.23)
Chloride: 98 mmol/L — ABNORMAL LOW (ref 101–111)
Creatinine, Ser: 1 mg/dL (ref 0.61–1.24)
Glucose, Bld: 76 mg/dL (ref 65–99)
HCT: 50 % (ref 39.0–52.0)
Hemoglobin: 17 g/dL (ref 13.0–17.0)
Potassium: 3.5 mmol/L (ref 3.5–5.1)
Sodium: 141 mmol/L (ref 135–145)
TCO2: 27 mmol/L (ref 0–100)

## 2015-09-04 LAB — RAPID STREP SCREEN (MED CTR MEBANE ONLY): Streptococcus, Group A Screen (Direct): NEGATIVE

## 2015-09-04 MED ORDER — ONDANSETRON HCL 4 MG PO TABS: 4.0000 mg | ORAL_TABLET | Freq: Four times a day (QID) | ORAL | Status: DC

## 2015-09-04 MED ORDER — ONDANSETRON HCL 4 MG PO TABS
4.0000 mg | ORAL_TABLET | Freq: Once | ORAL | Status: AC
  Administered 2015-09-04: 4 mg via ORAL
  Filled 2015-09-04: qty 1

## 2015-09-04 NOTE — ED Notes (Signed)
Pt reports onset sore throat, generalized muscle aches/malaise, fever, emesis since yesterday. States flu going around his home. States no documented fever. Taking no medications for fever/body aches.

## 2015-09-04 NOTE — ED Provider Notes (Signed)
CSN: [id number]     Arrival date & time 09/04/15  1745 History  By signing my name below, I, Bethel Born, attest that this documentation has been prepared under the direction and in the presence of Tiearra Colwell PA-C. Electronically Signed: Bethel Born, ED Scribe. 09/04/2015 6:35 PM   Chief Complaint  Patient presents with  . Sore Throat  . Emesis    The history is provided by the patient. No language interpreter was used.   Vincent West is a 25 y.o. male who presents to the Emergency Department complaining of constant, aching, 7/10 in severity, sore throat with onset yesterday. Pain is exacerbated by swallowing. Denies difficulty handling secretions or breathing. Associated symptoms include subjective fever, sweating, decreased appetite, generalized fatigue, throbbing headache, cough productive of clear and green sputum, nausea, and 3 episodes of emesis yesterday. He states his headache is over his forehead and exacerbated by coughing. Denies associated neck pain, neck stiffness, visual disturbances or AMS. He states his cough has been worsening since onset of symptoms. Denies blood in his sputum. He states he is still currently nauseous but has not vomited since yesterday. Denies blood in his vomit. Denies abdominal pain or diarrhea. His last meal was near 3 PM yesterday but he is still drinking fluids. He took nothing for his symptoms PTA. His little brother has similar symptoms who lives in the home with him. Pt did not have a flu shot this year. He has no other complaints today.   Past Medical History  Diagnosis Date  . Asthma    History reviewed. No pertinent past surgical history. No family history on file. Social History  Substance Use Topics  . Smoking status: Current Every Day Smoker -- 0.50 packs/day    Types: Cigarettes  . Smokeless tobacco: None  . Alcohol Use: Yes     Comment: occ     Review of Systems  Constitutional: Positive for fever (subjective),  diaphoresis and appetite change.  HENT: Positive for sore throat. Negative for congestion, ear pain, postnasal drip, rhinorrhea, sinus pressure and trouble swallowing.   Eyes: Negative for discharge, redness and visual disturbance.  Respiratory: Positive for cough. Negative for shortness of breath and wheezing.   Cardiovascular: Negative for chest pain.  Gastrointestinal: Positive for nausea and vomiting. Negative for abdominal pain, diarrhea and constipation.  Musculoskeletal: Negative for myalgias, back pain, neck pain and neck stiffness.  Skin: Negative for rash.  Neurological: Positive for headaches. Negative for dizziness, syncope and light-headedness.  All other systems reviewed and are negative.     Allergies  Review of patient's allergies indicates no known allergies.  Home Medications   Prior to Admission medications   Medication Sig Start Date End Date Taking? Authorizing Provider  ondansetron (ZOFRAN) 4 MG tablet Take 1 tablet (4 mg total) by mouth every 6 (six) hours. 09/04/15   Rolm Gala Laquiesha Piacente, PA-C  promethazine (PHENERGAN) 25 MG tablet Take 1 tablet (25 mg total) by mouth every 8 (eight) hours as needed for nausea or vomiting. 01/19/15   Charlestine Night, PA-C   BP 126/70 mmHg  Pulse 95  Temp(Src) 99.6 F (37.6 C) (Oral)  Resp 18  Ht  (1.651 m)  Wt 155 lb (70.308 kg)  BMI 25.79 kg/m2  SpO2 99% Physical Exam  Constitutional: He appears well-developed and well-nourished. No distress.  Nontoxic appearing  HENT:  Head: Normocephalic and atraumatic.  Right Ear: Tympanic membrane, external ear and ear canal normal.  Left Ear: Tympanic membrane, external ear and ear  canal normal.  Nose: Nose normal. No mucosal edema or rhinorrhea. Right sinus exhibits no maxillary sinus tenderness and no frontal sinus tenderness. Left sinus exhibits no maxillary sinus tenderness and no frontal sinus tenderness.  Mouth/Throat: Uvula is midline and mucous membranes are normal. Mucous  membranes are not dry. No trismus in the jaw. No uvula swelling. Posterior oropharyngeal erythema present. No oropharyngeal exudate, posterior oropharyngeal edema or tonsillar abscesses.  Eyes: Conjunctivae and EOM are normal. Right eye exhibits no discharge. Left eye exhibits no discharge. No scleral icterus.  Neck: Normal range of motion. Neck supple.  No meningeal signs  Cardiovascular: Normal rate, regular rhythm and normal heart sounds.   Pulmonary/Chest: Effort normal and breath sounds normal. No respiratory distress. He has no wheezes. He has no rales.  Abdominal: Soft. There is no tenderness. There is no rebound and no guarding.  Musculoskeletal: Normal range of motion.  Moves all extremities spontaneously  Lymphadenopathy:    He has no cervical adenopathy.  Neurological: He is alert. Coordination normal.  Skin: Skin is warm and dry.  Psychiatric: He has a normal mood and affect. His behavior is normal.  Nursing note and vitals reviewed.   ED Course  Procedures (including critical care time) DIAGNOSTIC STUDIES: Oxygen Saturation is 99% on RA,  normal by my interpretation.    COORDINATION OF CARE: 6:34 PM Discussed treatment plan which includes lab work, CXR, and Zofran with pt at bedside and pt agreed to plan.  Labs Review Labs Reviewed  I-STAT CHEM 8, ED - Abnormal; Notable for the following:    Chloride 98 (*)    All other components within normal limits  RAPID STREP SCREEN (NOT AT [facility])  CULTURE, GROUP A STREP [facility])    Imaging Review Dg Chest 2 View  09/04/2015  CLINICAL DATA:  Mid chest pain when coughs. Pain with SOB and productive cough began yesterday. Fever today. EXAM: CHEST  2 VIEW COMPARISON:  07/24/2013 FINDINGS: The heart size and mediastinal contours are within normal limits. Both lungs are clear. No pleural effusion or pneumothorax. The visualized skeletal structures are unremarkable. IMPRESSION: Normal chest radiographs. Electronically Signed   By: Amie Portland M.D.   On: 09/04/2015 18:54   I have personally reviewed and evaluated these images and lab results as part of my medical decision-making.   EKG Interpretation None      MDM   Final diagnoses:  Viral illness   25 year old male presenting with multiple point tenderness 2 days. Symptoms include throbbing headache, productive cough, sore throat, nausea, vomiting, generalized fatigue and subjective fevers. He has not tried any medications prior to arrival. Patient is afebrile. Nontoxic-appearing. Oropharyngeal erythema noted without exudate or edema. Lungs are clear to auscultation bilaterally. Abdomen is soft, non-tender. Abnormalities noted on blood work. Rapid strep negative. Chest x-ray negative. Patient's presentation consistent with viral illness. Treated with Zofran in the emergency department which she reports improved his nausea. Will discharge with symptomatic care. Patient is to follow-up with his PCP if his symptoms do not improve in the next few days. Return precautions given in discharge paperwork and discussed with pt at bedside. Pt stable for discharge  I personally performed the services described in this documentation, which was scribed in my presence. The recorded information has been reviewed and is accurate.   Rolm Gala Dene Nazir, PA-C 09/04/15 2015  Lyndal Pulley, MD 09/05/15 [phone]

## 2015-09-04 NOTE — Discharge Instructions (Signed)
Viral Infections °A viral infection can be caused by different types of viruses. Most viral infections are not serious and resolve on their own. However, some infections may cause severe symptoms and may lead to further complications. °SYMPTOMS °Viruses can frequently cause: °· Minor sore throat. °· Aches and pains. °· Headaches. °· Runny nose. °· Different types of rashes. °· Watery eyes. °· Tiredness. °· Cough. °· Loss of appetite. °· Gastrointestinal infections, resulting in nausea, vomiting, and diarrhea. °These symptoms do not respond to antibiotics because the infection is not caused by bacteria. However, you might catch a bacterial infection following the viral infection. This is sometimes called a "superinfection." Symptoms of such a bacterial infection may include: °· Worsening sore throat with pus and difficulty swallowing. °· Swollen neck glands. °· Chills and a high or persistent fever. °· Severe headache. °· Tenderness over the sinuses. °· Persistent overall ill feeling (malaise), muscle aches, and tiredness (fatigue). °· Persistent cough. °· Yellow, green, or brown mucus production with coughing. °HOME CARE INSTRUCTIONS  °· Only take over-the-counter or prescription medicines for pain, discomfort, diarrhea, or fever as directed by your caregiver. °· Drink enough water and fluids to keep your urine clear or pale yellow. Sports drinks can provide valuable electrolytes, sugars, and hydration. °· Get plenty of rest and maintain proper nutrition. Soups and broths with crackers or rice are fine. °SEEK IMMEDIATE MEDICAL CARE IF:  °· You have severe headaches, shortness of breath, chest pain, neck pain, or an unusual rash. °· You have uncontrolled vomiting, diarrhea, or you are unable to keep down fluids. °· You or your child has an oral temperature above 102° F (38.9° C), not controlled by medicine. °· Your baby is older than 3 months with a rectal temperature of 102° F (38.9° C) or higher. °· Your baby is 3  months old or younger with a rectal temperature of 100.4° F (38° C) or higher. °MAKE SURE YOU:  °· Understand these instructions. °· Will watch your condition. °· Will get help right away if you are not doing well or get worse. °  °This information is not intended to replace advice given to you by your health care provider. Make sure you discuss any questions you have with your health care provider. °  °Document Released: 04/21/2005 Document Revised: 10/04/2011 Document Reviewed: 12/18/2014 °Elsevier Interactive Patient Education ©2016 Elsevier Inc. ° °Nausea and Vomiting °Nausea is a sick feeling that often comes before throwing up (vomiting). Vomiting is a reflex where stomach contents come out of your mouth. Vomiting can cause severe loss of body fluids (dehydration). Children and elderly adults can become dehydrated quickly, especially if they also have diarrhea. Nausea and vomiting are symptoms of a condition or disease. It is important to find the cause of your symptoms. °CAUSES  °· Direct irritation of the stomach lining. This irritation can result from increased acid production (gastroesophageal reflux disease), infection, food poisoning, taking certain medicines (such as nonsteroidal anti-inflammatory drugs), alcohol use, or tobacco use. °· Signals from the brain. These signals could be caused by a headache, heat exposure, an inner ear disturbance, increased pressure in the brain from injury, infection, a tumor, or a concussion, pain, emotional stimulus, or metabolic problems. °· An obstruction in the gastrointestinal tract (bowel obstruction). °· Illnesses such as diabetes, hepatitis, gallbladder problems, appendicitis, kidney problems, cancer, sepsis, atypical symptoms of a heart attack, or eating disorders. °· Medical treatments such as chemotherapy and radiation. °· Receiving medicine that makes you sleep (general anesthetic) during surgery. °DIAGNOSIS °  Your caregiver may ask for tests to be done if the  problems do not improve after a few days. Tests may also be done if symptoms are severe or if the reason for the nausea and vomiting is not clear. Tests may include: °· Urine tests. °· Blood tests. °· Stool tests. °· Cultures (to look for evidence of infection). °· X-rays or other imaging studies. °Test results can help your caregiver make decisions about treatment or the need for additional tests. °TREATMENT °You need to stay well hydrated. Drink frequently but in small amounts. You may wish to drink water, sports drinks, clear broth, or eat frozen ice pops or gelatin dessert to help stay hydrated. When you eat, eating slowly may help prevent nausea. There are also some antinausea medicines that may help prevent nausea. °HOME CARE INSTRUCTIONS  °· Take all medicine as directed by your caregiver. °· If you do not have an appetite, do not force yourself to eat. However, you must continue to drink fluids. °· If you have an appetite, eat a normal diet unless your caregiver tells you differently. °¨ Eat a variety of complex carbohydrates (rice, wheat, potatoes, bread), lean meats, yogurt, fruits, and vegetables. °¨ Avoid high-fat foods because they are more difficult to digest. °· Drink enough water and fluids to keep your urine clear or pale yellow. °· If you are dehydrated, ask your caregiver for specific rehydration instructions. Signs of dehydration may include: °¨ Severe thirst. °¨ Dry lips and mouth. °¨ Dizziness. °¨ Dark urine. °¨ Decreasing urine frequency and amount. °¨ Confusion. °¨ Rapid breathing or pulse. °SEEK IMMEDIATE MEDICAL CARE IF:  °· You have blood or brown flecks (like coffee grounds) in your vomit. °· You have black or bloody stools. °· You have a severe headache or stiff neck. °· You are confused. °· You have severe abdominal pain. °· You have chest pain or trouble breathing. °· You do not urinate at least once every 8 hours. °· You develop cold or clammy skin. °· You continue to vomit for longer  than 24 to 48 hours. °· You have a fever. °MAKE SURE YOU:  °· Understand these instructions. °· Will watch your condition. °· Will get help right away if you are not doing well or get worse. °  °This information is not intended to replace advice given to you by your health care provider. Make sure you discuss any questions you have with your health care provider. °  °Document Released: 07/12/2005 Document Revised: 10/04/2011 Document Reviewed: 12/09/2010 °Elsevier Interactive Patient Education ©2016 Elsevier Inc. ° °

## 2015-09-04 NOTE — ED Notes (Signed)
Pt drinking ginger ale without difficulty  

## 2015-09-05 ENCOUNTER — Encounter (HOSPITAL_COMMUNITY): Payer: Self-pay | Admitting: *Deleted

## 2015-09-05 ENCOUNTER — Emergency Department (HOSPITAL_COMMUNITY)
Admission: EM | Admit: 2015-09-05 | Discharge: 2015-09-05 | Disposition: A | Discharge status: Home / Self Care | Payer: Self-pay | Attending: Emergency Medicine | Admitting: Emergency Medicine

## 2015-09-05 DIAGNOSIS — F17210 Nicotine dependence, cigarettes, uncomplicated: Secondary | ICD-10-CM | POA: Insufficient documentation

## 2015-09-05 DIAGNOSIS — Z79899 Other long term (current) drug therapy: Secondary | ICD-10-CM | POA: Insufficient documentation

## 2015-09-05 DIAGNOSIS — B349 Viral infection, unspecified: Secondary | ICD-10-CM | POA: Insufficient documentation

## 2015-09-05 DIAGNOSIS — J45909 Unspecified asthma, uncomplicated: Secondary | ICD-10-CM | POA: Insufficient documentation

## 2015-09-05 LAB — CBC
HCT: 41.1 % (ref 39.0–52.0)
Hemoglobin: 14.7 g/dL (ref 13.0–17.0)
MCH: 29.8 pg (ref 26.0–34.0)
MCHC: 35.8 g/dL (ref 30.0–36.0)
MCV: 83.4 fL (ref 78.0–100.0)
PLATELETS: 235 10*3/uL (ref 150–400)
RBC: 4.93 MIL/uL (ref 4.22–5.81)
RDW: 13.3 % (ref 11.5–15.5)
WBC: 8.1 10*3/uL (ref 4.0–10.5)

## 2015-09-05 LAB — COMPREHENSIVE METABOLIC PANEL
ALBUMIN: 4.2 g/dL (ref 3.5–5.0)
ALK PHOS: 57 U/L (ref 38–126)
ALT: 12 U/L — AB (ref 17–63)
AST: 25 U/L (ref 15–41)
Anion gap: 10 (ref 5–15)
BILIRUBIN TOTAL: 1 mg/dL (ref 0.3–1.2)
BUN: 7 mg/dL (ref 6–20)
CALCIUM: 9.1 mg/dL (ref 8.9–10.3)
CO2: 24 mmol/L (ref 22–32)
CREATININE: 1.11 mg/dL (ref 0.61–1.24)
Chloride: 103 mmol/L (ref 101–111)
GFR calc Af Amer: >60 mL/min (ref >60)
GLUCOSE: 108 mg/dL — AB (ref 65–99)
POTASSIUM: 3.8 mmol/L (ref 3.5–5.1)
Sodium: 137 mmol/L (ref 135–145)
TOTAL PROTEIN: 7.4 g/dL (ref 6.5–8.1)

## 2015-09-05 LAB — URINALYSIS, ROUTINE W REFLEX MICROSCOPIC
BILIRUBIN URINE: NEGATIVE
Glucose, UA: NEGATIVE mg/dL
Hgb urine dipstick: NEGATIVE
LEUKOCYTES UA: NEGATIVE
NITRITE: NEGATIVE
PH: 7.5 (ref 5.0–8.0)
PROTEIN: NEGATIVE mg/dL
Specific Gravity, Urine: 1.025 (ref 1.005–1.030)

## 2015-09-05 LAB — LIPASE, BLOOD: Lipase: 23 U/L (ref 11–51)

## 2015-09-05 MED ORDER — ACETAMINOPHEN 325 MG PO TABS
650.0000 mg | ORAL_TABLET | Freq: Once | ORAL | Status: AC
  Administered 2015-09-05: 650 mg via ORAL
  Filled 2015-09-05: qty 2

## 2015-09-05 MED ORDER — ONDANSETRON 4 MG PO TBDP
4.0000 mg | ORAL_TABLET | Freq: Once | ORAL | Status: AC
  Administered 2015-09-05: 4 mg via ORAL
  Filled 2015-09-05: qty 1

## 2015-09-05 MED ORDER — SODIUM CHLORIDE 0.9 % IV BOLUS (SEPSIS)
1000.0000 mL | Freq: Once | INTRAVENOUS | Status: AC
  Administered 2015-09-05: 1000 mL via INTRAVENOUS
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
## 2015-09-05 NOTE — Discharge Instructions (Signed)
Please read and follow all provided instructions.  Your diagnoses today include:  1. Viral syndrome    You appear to have an upper respiratory infection (URI). An upper respiratory tract infection, or cold, is a viral infection of the air passages leading to the lungs. It should improve gradually after 5-7 days. You may have a lingering cough that lasts for 2- 4 weeks after the infection.  Tests performed today include:  Vital signs. See below for your results today.   Medications prescribed:   Take any prescribed medications only as directed. Treatment for your infection is aimed at treating the symptoms. There are no medications, such as antibiotics, that will cure your infection.   Home care instructions:  Follow any educational materials contained in this packet.   Your illness is contagious and can be spread to others, especially during the first 3 or 4 days. It cannot be cured by antibiotics or other medicines. Take basic precautions such as washing your hands often, covering your mouth when you cough or sneeze, and avoiding public places where you could spread your illness to others.   Please continue drinking plenty of fluids.  Use over-the-counter medicines as needed as directed on packaging for symptom relief.  You may also use ibuprofen or tylenol as directed on packaging for pain or fever.  Do not take multiple medicines containing Tylenol or acetaminophen to avoid taking too much of this medication.  Follow-up instructions: Please follow-up with your primary care provider in the next 3 days for further evaluation of your symptoms if you are not feeling better.   Return instructions:   Please return to the Emergency Department if you experience worsening symptoms.   RETURN IMMEDIATELY IF you develop shortness of breath, confusion or altered mental status, a new rash, become dizzy, faint, or poorly responsive, or are unable to be cared for at home.  Please return if you have  persistent vomiting and cannot keep down fluids or develop a fever that is not controlled by tylenol or motrin.    Please return if you have any other emergent concerns.  Additional Information:  Your vital signs today were: BP 125/67 mmHg   Pulse 73   Temp(Src) 99.7 F (37.6 C)   Resp 16   SpO2 100% If your blood pressure (BP) was elevated above 135/85 this visit, please have this repeated by your doctor within one month. --------------

## 2015-09-05 NOTE — ED Notes (Addendum)
Pt reports N/V and abdominal pain since yesterday. Pt also reports Migraine that started today. Pt was seen yesterday for the same.

## 2015-09-07 LAB — CULTURE, GROUP A STREP (THRC)

## 2015-10-10 ENCOUNTER — Encounter (HOSPITAL_COMMUNITY): Payer: Self-pay | Admitting: Emergency Medicine

## 2015-10-10 ENCOUNTER — Emergency Department (HOSPITAL_COMMUNITY): Payer: Self-pay

## 2015-10-10 ENCOUNTER — Emergency Department (HOSPITAL_COMMUNITY)
Admission: EM | Admit: 2015-10-10 | Discharge: 2015-10-11 | Disposition: A | Discharge status: Home / Self Care | Payer: Self-pay | Attending: Emergency Medicine | Admitting: Emergency Medicine

## 2015-10-10 DIAGNOSIS — Y9389 Activity, other specified: Secondary | ICD-10-CM | POA: Insufficient documentation

## 2015-10-10 DIAGNOSIS — S41111A Laceration without foreign body of right upper arm, initial encounter: Secondary | ICD-10-CM | POA: Insufficient documentation

## 2015-10-10 DIAGNOSIS — T148XXA Other injury of unspecified body region, initial encounter: Secondary | ICD-10-CM

## 2015-10-10 DIAGNOSIS — F17200 Nicotine dependence, unspecified, uncomplicated: Secondary | ICD-10-CM | POA: Insufficient documentation

## 2015-10-10 DIAGNOSIS — Z23 Encounter for immunization: Secondary | ICD-10-CM | POA: Insufficient documentation

## 2015-10-10 DIAGNOSIS — S41112A Laceration without foreign body of left upper arm, initial encounter: Secondary | ICD-10-CM | POA: Insufficient documentation

## 2015-10-10 DIAGNOSIS — Y9289 Other specified places as the place of occurrence of the external cause: Secondary | ICD-10-CM | POA: Insufficient documentation

## 2015-10-10 DIAGNOSIS — W260XXA Contact with knife, initial encounter: Secondary | ICD-10-CM | POA: Insufficient documentation

## 2015-10-10 DIAGNOSIS — Y998 Other external cause status: Secondary | ICD-10-CM | POA: Insufficient documentation

## 2015-10-10 LAB — COMPREHENSIVE METABOLIC PANEL
ALBUMIN: 4.1 g/dL (ref 3.5–5.0)
ALT: 16 U/L — AB (ref 17–63)
AST: 42 U/L — AB (ref 15–41)
Alkaline Phosphatase: 55 U/L (ref 38–126)
Anion gap: 12 (ref 5–15)
BUN: 9 mg/dL (ref 6–20)
CHLORIDE: 103 mmol/L (ref 101–111)
CO2: 25 mmol/L (ref 22–32)
CREATININE: 1.13 mg/dL (ref 0.61–1.24)
Calcium: 9.6 mg/dL (ref 8.9–10.3)
GFR calc non Af Amer: >60 mL/min (ref >60)
Glucose, Bld: 119 mg/dL — ABNORMAL HIGH (ref 65–99)
Potassium: 3.7 mmol/L (ref 3.5–5.1)
SODIUM: 140 mmol/L (ref 135–145)
Total Bilirubin: 0.4 mg/dL (ref 0.3–1.2)
Total Protein: 7 g/dL (ref 6.5–8.1)

## 2015-10-10 LAB — PROTIME-INR
INR: 0.99 (ref 0.00–1.49)
Prothrombin Time: 13.3 seconds (ref 11.6–15.2)

## 2015-10-10 LAB — CBC
HEMATOCRIT: 39.7 % (ref 39.0–52.0)
Hemoglobin: 13.8 g/dL (ref 13.0–17.0)
MCH: 29.1 pg (ref 26.0–34.0)
MCHC: 34.8 g/dL (ref 30.0–36.0)
MCV: 83.8 fL (ref 78.0–100.0)
PLATELETS: 229 10*3/uL (ref 150–400)
RBC: 4.74 MIL/uL (ref 4.22–5.81)
RDW: 13 % (ref 11.5–15.5)
WBC: 7 10*3/uL (ref 4.0–10.5)

## 2015-10-10 LAB — PREPARE FRESH FROZEN PLASMA
UNIT DIVISION: 0
Unit division: 0

## 2015-10-10 LAB — ETHANOL: Alcohol, Ethyl (B): <5 mg/dL (ref <5)

## 2015-10-10 LAB — ABO/RH: ABO/RH(D): O POS

## 2015-10-10 MED ORDER — TETANUS-DIPHTHERIA TOXOIDS TD 5-2 LFU IM INJ: 0.5000 mL | INJECTION | Freq: Once | INTRAMUSCULAR | Status: DC

## 2015-10-10 MED ORDER — MORPHINE SULFATE (PF) 4 MG/ML IV SOLN
6.0000 mg | Freq: Once | INTRAVENOUS | Status: AC
  Administered 2015-10-10: 6 mg via INTRAVENOUS
  Filled 2015-10-10: qty 2

## 2015-10-10 MED ORDER — TETANUS-DIPHTH-ACELL PERTUSSIS 5-2.5-18.5 LF-MCG/0.5 IM SUSP
0.5000 mL | Freq: Once | INTRAMUSCULAR | Status: AC
  Administered 2015-10-10: 0.5 mL via INTRAMUSCULAR
  Filled 2015-10-10: qty 0.5

## 2015-10-10 MED ORDER — HYDROCODONE-ACETAMINOPHEN 5-325 MG PO TABS: 1.0000 | ORAL_TABLET | Freq: Four times a day (QID) | ORAL | Status: DC | PRN

## 2015-10-10 NOTE — ED Notes (Signed)
Dr. Festus Aloegoebel at the bedside.

## 2015-10-10 NOTE — ED Notes (Signed)
MD placed 1 staple in left shoulder and right flank.
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
## 2015-10-10 NOTE — ED Notes (Signed)
bandaid placed over staples in left shoulder and right flank.
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
## 2015-10-10 NOTE — ED Notes (Signed)
Family at bedside. 

## 2015-10-10 NOTE — ED Notes (Signed)
Called provider for pain medication and tetanus shot. MD acknowledges, awaiting new orders.

## 2015-10-11 LAB — TYPE AND SCREEN
ABO/RH(D): O POS
ANTIBODY SCREEN: NEGATIVE
Unit division: 0
Unit division: 0

## 2015-10-11 LAB — CDS SEROLOGY

## 2015-10-13 ENCOUNTER — Encounter (HOSPITAL_COMMUNITY): Payer: Self-pay | Admitting: *Deleted

## 2016-02-12 ENCOUNTER — Emergency Department (HOSPITAL_COMMUNITY)
Admission: EM | Admit: 2016-02-12 | Discharge: 2016-02-13 | Disposition: A | Discharge status: Home / Self Care | Payer: Self-pay | Attending: Emergency Medicine | Admitting: Emergency Medicine

## 2016-02-12 ENCOUNTER — Encounter (HOSPITAL_COMMUNITY): Payer: Self-pay | Admitting: Emergency Medicine

## 2016-02-12 DIAGNOSIS — R369 Urethral discharge, unspecified: Secondary | ICD-10-CM | POA: Insufficient documentation

## 2016-02-12 DIAGNOSIS — Z202 Contact with and (suspected) exposure to infections with a predominantly sexual mode of transmission: Secondary | ICD-10-CM | POA: Insufficient documentation

## 2016-02-12 DIAGNOSIS — A64 Unspecified sexually transmitted disease: Secondary | ICD-10-CM

## 2016-02-12 DIAGNOSIS — F17210 Nicotine dependence, cigarettes, uncomplicated: Secondary | ICD-10-CM | POA: Insufficient documentation

## 2016-02-12 DIAGNOSIS — J45909 Unspecified asthma, uncomplicated: Secondary | ICD-10-CM | POA: Insufficient documentation

## 2016-02-12 LAB — URINALYSIS, ROUTINE W REFLEX MICROSCOPIC
Glucose, UA: NEGATIVE mg/dL
Ketones, ur: >80 mg/dL — AB
NITRITE: NEGATIVE
Protein, ur: NEGATIVE mg/dL
SPECIFIC GRAVITY, URINE: 1.033 — AB (ref 1.005–1.030)
pH: 6.5 (ref 5.0–8.0)

## 2016-02-12 LAB — URINE MICROSCOPIC-ADD ON

## 2016-02-12 MED ORDER — STERILE WATER FOR INJECTION IJ SOLN
INTRAMUSCULAR | Status: AC
  Filled 2016-02-12: qty 10

## 2016-02-12 MED ORDER — AZITHROMYCIN 250 MG PO TABS
1000.0000 mg | ORAL_TABLET | Freq: Once | ORAL | Status: AC
  Administered 2016-02-12: 1000 mg via ORAL
  Filled 2016-02-12: qty 4

## 2016-02-12 MED ORDER — CEFTRIAXONE SODIUM 250 MG IJ SOLR
250.0000 mg | Freq: Once | INTRAMUSCULAR | Status: AC
  Administered 2016-02-12: 250 mg via INTRAMUSCULAR
  Filled 2016-02-12: qty 250

## 2016-02-12 NOTE — ED Provider Notes (Signed)
CSN: [id number]     Arrival date & time 02/12/16  2110 History  By signing my name below, I, Phillis Haggis, attest that this documentation has been prepared under the direction and in the presence of [employer], PA-C. Electronically Signed: Phillis Haggis, ED Scribe. 02/12/2016. 10:00 PM.   Chief Complaint  Patient presents with  . Penile Discharge  . Exposure to STD   The history is provided by the patient. No language interpreter was used.  HPI Comments: Vincent West is a 25 y.o. male who presents to the Emergency Department requesting an STD screening. Pt reports penile discharge, dysuria, and sore throat 4 days ago. He reports associated headache, chills, SOB, decreased appetite, and nausea. Pt reports that he has not had any new sexual partners. Pt is able to tolerate liquids. He has taken Tylenol for his symptoms to relief. He denies fever, cough, rhinorrhea, sinus pressure, back pain, hematuria, testicular pain, or vomiting.   Past Medical History  Diagnosis Date  . Asthma    History reviewed. No pertinent past surgical history. No family history on file. Social History  Substance Use Topics  . Smoking status: Current Every Day Smoker -- 0.00 packs/day    Types: Cigarettes  . Smokeless tobacco: None  . Alcohol Use: Yes     Comment: occ     Review of Systems  All other systems reviewed and are negative.  Allergies  Review of patient's allergies indicates no known allergies.  Home Medications   Prior to Admission medications   Medication Sig Start Date End Date Taking? Authorizing Provider  cephALEXin (KEFLEX) 500 MG capsule Take 1 capsule (500 mg total) by mouth 2 (two) times daily. 02/13/16   Eyvonne Mechanic, PA-C  doxycycline (VIBRAMYCIN) 100 MG capsule Take 1 capsule (100 mg total) by mouth 2 (two) times daily. 02/13/16   Eyvonne Mechanic, PA-C  HYDROcodone-acetaminophen (NORCO/VICODIN) 5-325 MG tablet Take 1 tablet by mouth every 6 (six) hours as needed for severe  pain. 10/10/15   Lula Olszewski, MD  ondansetron (ZOFRAN) 4 MG tablet Take 1 tablet (4 mg total) by mouth every 6 (six) hours. 02/13/16   Eyvonne Mechanic, PA-C  promethazine (PHENERGAN) 25 MG tablet Take 1 tablet (25 mg total) by mouth every 8 (eight) hours as needed for nausea or vomiting. Patient not taking: Reported on 09/05/2015 01/19/15   Charlestine Night, PA-C   BP 144/88 mmHg  Pulse 82  Temp(Src) 98.7 F (37.1 C) (Oral)  Resp 18  SpO2 92% Physical Exam  Constitutional: He is oriented to person, place, and time. He appears well-developed and well-nourished.  HENT:  Head: Normocephalic and atraumatic.  Mouth/Throat: Oropharynx is clear and moist.  Eyes: Conjunctivae and EOM are normal. Pupils are equal, round, and reactive to light.  Neck: Normal range of motion. Neck supple.  Genitourinary: Testes normal. Right testis shows no mass and no tenderness. Left testis shows no mass and no tenderness. Circumcised. Discharge found.  Examination conducted by Carey Bullocks, PA-S; Examination chaperoned by Eyvonne Mechanic, PA-C and Cherlynn Perches, ED scribe. Scant amount of white tinged discharge  Musculoskeletal: Normal range of motion.  Neurological: He is alert and oriented to person, place, and time.  Skin: Skin is warm and dry.  Psychiatric: He has a normal mood and affect. His behavior is normal.  Nursing note and vitals reviewed.   ED Course  Procedures (including critical care time) DIAGNOSTIC STUDIES: Oxygen Saturation is 99% on RA, normal by my interpretation.    COORDINATION  OF CARE: 10:00 PM-Discussed treatment plan which includes labs and anti-biotics with pt at bedside and pt agreed to plan.   10:27 PM- immediately after administration of Rocephin vaccination, pt began to vomit and feel lightheaded. O2 tank and IV to be administered to the pt. Pt is complaining of tingling in the mouth.  Labs Review Labs Reviewed  URINALYSIS, ROUTINE W REFLEX MICROSCOPIC (NOT AT [facility]) -  Abnormal; Notable for the following:    APPearance TURBID (*)    Specific Gravity, Urine 1.033 (*)    Hgb urine dipstick TRACE (*)    Bilirubin Urine SMALL (*)    Ketones, ur >80 (*)    Leukocytes, UA LARGE (*)    All other components within normal limits  URINE MICROSCOPIC-ADD ON - Abnormal; Notable for the following:    Squamous Epithelial / LPF 0-5 (*)    Bacteria, UA FEW (*)    All other components within normal limits  RPR  HIV ANTIBODY (ROUTINE TESTING)  GC/CHLAMYDIA PROBE AMP ([place]) NOT [facility]    Imaging Review No results found. I have personally reviewed and evaluated these images and lab results as part of my medical decision-making.   EKG Interpretation None      MDM   Final diagnoses:  STD (male)   Labs: UA RPR and HIV antibody  Imaging:  Consults:  Therapeutics: Azithromycin, ceftriaxone  Discharge Meds: Doxycycline  Assessment/Plan:  Patient treated in the ED for STI with azithromycin and ceftriaxone. Shortly after taking the azithromycin patient started to have abdominal pain and vomiting. Initial concern for anaphylactic reaction, IV started normal saline started. Patient had no worsening symptoms, had no airway involvement, no rash, no other organ involvement, he had dramatic improvement in symptoms after several minutes, he was watched here in the ED with no return of symptoms, and no complaints. This is unlikely a allergic reaction, most likely due to high load of azithromycin on patient's stomach. Patient will be discharged home with doxycycline, and strict return precautions. Patient verbalized understanding and agreement to today's plan had no further questions or concerns at time of discharge  Patient advised to inform and treat all sexual partners.  Pt advised on safe sex practices and understands that they have GC/Chlamydia cultures pending and will result in 2-3 days. HIV and RPR sent. Pt encouraged to follow up at local health  department for future STI checks. No concern for prostatitis or epididymitis. Discussed return precautions.    I personally performed the services described in this documentation, which was scribed in my presence. The recorded information has been reviewed and is accurate.   Eyvonne MechanicJeffrey Jerimie Mancuso, PA-C 02/13/16 0222  Rolan BuccoMelanie Belfi, MD 02/13/16 1004
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
## 2016-02-12 NOTE — ED Notes (Signed)
Pt. requesting STD screening , reports penile discharge , urinary discomfort /dysuria onset this week , pt. added sore throat , no fever or chills.

## 2016-02-13 LAB — RPR: RPR Ser Ql: NONREACTIVE

## 2016-02-13 LAB — GC/CHLAMYDIA PROBE AMP (~~LOC~~) NOT AT ARMC
Chlamydia: NEGATIVE
Neisseria Gonorrhea: POSITIVE — AB

## 2016-02-13 LAB — HIV ANTIBODY (ROUTINE TESTING W REFLEX): HIV Screen 4th Generation wRfx: NONREACTIVE

## 2016-02-13 MED ORDER — ONDANSETRON HCL 4 MG PO TABS: 4.0000 mg | ORAL_TABLET | Freq: Four times a day (QID) | ORAL | Status: DC

## 2016-02-13 MED ORDER — CEPHALEXIN 250 MG PO CAPS: 500.0000 mg | ORAL_CAPSULE | Freq: Once | ORAL | Status: DC

## 2016-02-13 MED ORDER — CEPHALEXIN 500 MG PO CAPS: 500.0000 mg | ORAL_CAPSULE | Freq: Two times a day (BID) | ORAL | Status: DC

## 2016-02-13 MED ORDER — DOXYCYCLINE HYCLATE 100 MG PO CAPS: 100.0000 mg | ORAL_CAPSULE | Freq: Two times a day (BID) | ORAL | Status: DC

## 2016-02-13 NOTE — Discharge Instructions (Signed)
Please take medications as directed.   Sexually Transmitted Disease A sexually transmitted disease (STD) is a disease or infection that may be passed (transmitted) from person to person, usually during sexual activity. This may happen by way of saliva, semen, blood, vaginal mucus, or urine. Common STDs include:  Gonorrhea.  Chlamydia.  Syphilis.  HIV and AIDS.  Genital herpes.  Hepatitis B and C.  Trichomonas.  Human papillomavirus (HPV).  Pubic lice.  Scabies.  Mites.  Bacterial vaginosis. WHAT ARE CAUSES OF STDs? An STD may be caused by bacteria, a virus, or parasites. STDs are often transmitted during sexual activity if one person is infected. However, they may also be transmitted through nonsexual means. STDs may be transmitted after:   Sexual intercourse with an infected person.  Sharing sex toys with an infected person.  Sharing needles with an infected person or using unclean piercing or tattoo needles.  Having intimate contact with the genitals, mouth, or rectal areas of an infected person.  Exposure to infected fluids during birth. WHAT ARE THE SIGNS AND SYMPTOMS OF STDs? Different STDs have different symptoms. Some people may not have any symptoms. If symptoms are present, they may include:  Painful or bloody urination.  Pain in the pelvis, abdomen, vagina, anus, throat, or eyes.  A skin rash, itching, or irritation.  Growths, ulcerations, blisters, or sores in the genital and anal areas.  Abnormal vaginal discharge with or without bad odor.  Penile discharge in men.  Fever.  Pain or bleeding during sexual intercourse.  Swollen glands in the groin area.  Yellow skin and eyes (jaundice). This is seen with hepatitis.  Swollen testicles.  Infertility.  Sores and blisters in the mouth. HOW ARE STDs DIAGNOSED? To make a diagnosis, your health care provider may:  Take a medical history.  Perform a physical exam.  Take a sample of any  discharge to examine.  Swab the throat, cervix, opening to the penis, rectum, or vagina for testing.  Test a sample of your first morning urine.  Perform blood tests.  Perform a Pap test, if this applies.  Perform a colposcopy.  Perform a laparoscopy. HOW ARE STDs TREATED? Treatment depends on the STD. Some STDs may be treated but not cured.  Chlamydia, gonorrhea, trichomonas, and syphilis can be cured with antibiotic medicine.  Genital herpes, hepatitis, and HIV can be treated, but not cured, with prescribed medicines. The medicines lessen symptoms.  Genital warts from HPV can be treated with medicine or by freezing, burning (electrocautery), or surgery. Warts may come back.  HPV cannot be cured with medicine or surgery. However, abnormal areas may be removed from the cervix, vagina, or vulva.  If your diagnosis is confirmed, your recent sexual partners need treatment. This is true even if they are symptom-free or have a negative culture or evaluation. They should not have sex until their health care providers say it is okay.  Your health care provider may test you for infection again 3 months after treatment. HOW CAN I REDUCE MY RISK OF GETTING AN STD? Take these steps to reduce your risk of getting an STD:  Use latex condoms, dental dams, and water-soluble lubricants during sexual activity. Do not use petroleum jelly or oils.  Avoid having multiple sex partners.  Do not have sex with someone who has other sex partners  Do not have sex with anyone you do not know or who is at high risk for an STD.  Avoid risky sex practices that can break  your skin.  Do not have sex if you have open sores on your mouth or skin.  Avoid drinking too much alcohol or taking illegal drugs. Alcohol and drugs can affect your judgment and put you in a vulnerable position.  Avoid engaging in oral and anal sex acts.  Get vaccinated for HPV and hepatitis. If you have not received these vaccines  in the past, talk to your health care provider about whether one or both might be right for you.  If you are at risk of being infected with HIV, it is recommended that you take a prescription medicine daily to prevent HIV infection. This is called pre-exposure prophylaxis (PrEP). You are considered at risk if:  You are a man who has sex with other men (MSM).  You are a heterosexual man or woman and are sexually active with more than one partner.  You take drugs by injection.  You are sexually active with a partner who has HIV.  Talk with your health care provider about whether you are at high risk of being infected with HIV. If you choose to begin PrEP, you should first be tested for HIV. You should then be tested every 3 months for as long as you are taking PrEP. WHAT SHOULD I DO IF I THINK I HAVE AN STD?  See your health care provider.  Tell your sexual partner(s). They should be tested and treated for any STDs.  Do not have sex until your health care provider says it is okay. WHEN SHOULD I GET IMMEDIATE MEDICAL CARE? Contact your health care provider right away if:   You have severe abdominal pain.  You are a man and notice swelling or pain in your testicles.  You are a woman and notice swelling or pain in your vagina.   This information is not intended to replace advice given to you by your health care provider. Make sure you discuss any questions you have with your health care provider.   Document Released: 10/02/2002 Document Revised: 08/02/2014 Document Reviewed: 01/30/2013 Elsevier Interactive Patient Education [employer]

## 2016-02-16 ENCOUNTER — Telehealth (HOSPITAL_BASED_OUTPATIENT_CLINIC_OR_DEPARTMENT_OTHER): Payer: Self-pay | Admitting: Emergency Medicine

## 2016-03-26 ENCOUNTER — Emergency Department (HOSPITAL_COMMUNITY): Payer: Self-pay

## 2016-03-26 ENCOUNTER — Emergency Department (HOSPITAL_COMMUNITY)
Admission: EM | Admit: 2016-03-26 | Discharge: 2016-03-26 | Disposition: A | Discharge status: Home / Self Care | Payer: Self-pay | Attending: Emergency Medicine | Admitting: Emergency Medicine

## 2016-03-26 ENCOUNTER — Encounter (HOSPITAL_COMMUNITY): Payer: Self-pay | Admitting: *Deleted

## 2016-03-26 DIAGNOSIS — Y939 Activity, unspecified: Secondary | ICD-10-CM | POA: Insufficient documentation

## 2016-03-26 DIAGNOSIS — T148XXA Other injury of unspecified body region, initial encounter: Secondary | ICD-10-CM

## 2016-03-26 DIAGNOSIS — S21102A Unspecified open wound of left front wall of thorax without penetration into thoracic cavity, initial encounter: Secondary | ICD-10-CM | POA: Insufficient documentation

## 2016-03-26 DIAGNOSIS — S41102A Unspecified open wound of left upper arm, initial encounter: Secondary | ICD-10-CM | POA: Insufficient documentation

## 2016-03-26 DIAGNOSIS — F17210 Nicotine dependence, cigarettes, uncomplicated: Secondary | ICD-10-CM | POA: Insufficient documentation

## 2016-03-26 DIAGNOSIS — R079 Chest pain, unspecified: Secondary | ICD-10-CM | POA: Insufficient documentation

## 2016-03-26 DIAGNOSIS — Y999 Unspecified external cause status: Secondary | ICD-10-CM | POA: Insufficient documentation

## 2016-03-26 DIAGNOSIS — Y929 Unspecified place or not applicable: Secondary | ICD-10-CM | POA: Insufficient documentation

## 2016-03-26 DIAGNOSIS — J45909 Unspecified asthma, uncomplicated: Secondary | ICD-10-CM | POA: Insufficient documentation

## 2016-03-26 LAB — COMPREHENSIVE METABOLIC PANEL
ALT: 14 U/L — AB (ref 17–63)
AST: 32 U/L (ref 15–41)
Albumin: 4.2 g/dL (ref 3.5–5.0)
Alkaline Phosphatase: 61 U/L (ref 38–126)
Anion gap: 15 (ref 5–15)
BILIRUBIN TOTAL: 0.7 mg/dL (ref 0.3–1.2)
BUN: 8 mg/dL (ref 6–20)
CO2: 19 mmol/L — ABNORMAL LOW (ref 22–32)
CREATININE: 1.29 mg/dL — AB (ref 0.61–1.24)
Calcium: 9.4 mg/dL (ref 8.9–10.3)
Chloride: 104 mmol/L (ref 101–111)
Glucose, Bld: 128 mg/dL — ABNORMAL HIGH (ref 65–99)
POTASSIUM: 3.8 mmol/L (ref 3.5–5.1)
Sodium: 138 mmol/L (ref 135–145)
TOTAL PROTEIN: 7.8 g/dL (ref 6.5–8.1)

## 2016-03-26 LAB — TYPE AND SCREEN
ABO/RH(D): O POS
Antibody Screen: NEGATIVE
Unit division: 0
Unit division: 0

## 2016-03-26 LAB — PREPARE FRESH FROZEN PLASMA
UNIT DIVISION: 0
Unit division: 0

## 2016-03-26 LAB — CBC WITH DIFFERENTIAL/PLATELET
BASOS ABS: 0 10*3/uL (ref 0.0–0.1)
BASOS PCT: 1 %
EOS ABS: 0.2 10*3/uL (ref 0.0–0.7)
EOS PCT: 3 %
HCT: 40.8 % (ref 39.0–52.0)
HEMOGLOBIN: 14 g/dL (ref 13.0–17.0)
Lymphocytes Relative: 43 %
Lymphs Abs: 3.5 10*3/uL (ref 0.7–4.0)
MCH: 29.5 pg (ref 26.0–34.0)
MCHC: 34.3 g/dL (ref 30.0–36.0)
MCV: 85.9 fL (ref 78.0–100.0)
Monocytes Absolute: 0.7 10*3/uL (ref 0.1–1.0)
Monocytes Relative: 9 %
NEUTROS PCT: 44 %
Neutro Abs: 3.7 10*3/uL (ref 1.7–7.7)
PLATELETS: 297 10*3/uL (ref 150–400)
RBC: 4.75 MIL/uL (ref 4.22–5.81)
RDW: 13 % (ref 11.5–15.5)
WBC: 8.2 10*3/uL (ref 4.0–10.5)

## 2016-03-26 LAB — ABO/RH: ABO/RH(D): O POS

## 2016-03-26 MED ORDER — IOPAMIDOL (ISOVUE-300) INJECTION 61%
INTRAVENOUS | Status: AC
  Filled 2016-03-26: qty 100

## 2016-03-26 MED ORDER — FENTANYL CITRATE (PF) 100 MCG/2ML IJ SOLN
INTRAMUSCULAR | Status: AC | PRN
  Administered 2016-03-26: 50 ug via INTRAVENOUS

## 2016-03-26 MED ORDER — FENTANYL CITRATE (PF) 100 MCG/2ML IJ SOLN
INTRAMUSCULAR | Status: AC
  Filled 2016-03-26: qty 2

## 2016-03-26 MED ORDER — IOPAMIDOL (ISOVUE-300) INJECTION 61%
75.0000 mL | Freq: Once | INTRAVENOUS | Status: AC | PRN
  Administered 2016-03-26: 75 mL via INTRAVENOUS

## 2016-03-26 MED ORDER — HYDROCODONE-ACETAMINOPHEN 5-325 MG PO TABS
1.0000 | ORAL_TABLET | Freq: Once | ORAL | Status: AC
  Administered 2016-03-26: 1 via ORAL
  Filled 2016-03-26: qty 1

## 2016-03-26 NOTE — ED Notes (Signed)
Dr. Thompson at bedside. 

## 2016-03-26 NOTE — ED Notes (Signed)
Dr Janee Mornhompson speaking with pt.  States with discharge pt home now that CT results are negative.
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
## 2016-03-26 NOTE — Discharge Instructions (Signed)
Return if you have any foul-smelling drainage from wounds, redness/warmth around wounds, difficulty breathing, or fever.
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
## 2016-03-26 NOTE — ED Notes (Signed)
Bedside US neg for pneumo

## 2016-05-12 ENCOUNTER — Telehealth (HOSPITAL_BASED_OUTPATIENT_CLINIC_OR_DEPARTMENT_OTHER): Payer: Self-pay | Admitting: Emergency Medicine

## 2016-05-12 NOTE — Telephone Encounter (Signed)
Lost to followup 

## 2016-07-01 ENCOUNTER — Emergency Department (HOSPITAL_COMMUNITY)
Admission: EM | Admit: 2016-07-01 | Discharge: 2016-07-01 | Disposition: A | Discharge status: Home / Self Care | Payer: No Typology Code available for payment source | Attending: Emergency Medicine | Admitting: Emergency Medicine

## 2016-07-01 ENCOUNTER — Encounter (HOSPITAL_COMMUNITY): Payer: Self-pay | Admitting: *Deleted

## 2016-07-01 ENCOUNTER — Emergency Department (HOSPITAL_COMMUNITY): Payer: No Typology Code available for payment source

## 2016-07-01 DIAGNOSIS — S99921A Unspecified injury of right foot, initial encounter: Secondary | ICD-10-CM | POA: Diagnosis present

## 2016-07-01 DIAGNOSIS — Y9389 Activity, other specified: Secondary | ICD-10-CM | POA: Insufficient documentation

## 2016-07-01 DIAGNOSIS — S9031XA Contusion of right foot, initial encounter: Secondary | ICD-10-CM | POA: Diagnosis not present

## 2016-07-01 DIAGNOSIS — Y92410 Unspecified street and highway as the place of occurrence of the external cause: Secondary | ICD-10-CM | POA: Insufficient documentation

## 2016-07-01 DIAGNOSIS — Z87891 Personal history of nicotine dependence: Secondary | ICD-10-CM | POA: Diagnosis not present

## 2016-07-01 DIAGNOSIS — J45909 Unspecified asthma, uncomplicated: Secondary | ICD-10-CM | POA: Insufficient documentation

## 2016-07-01 DIAGNOSIS — Y999 Unspecified external cause status: Secondary | ICD-10-CM | POA: Insufficient documentation

## 2016-07-01 MED ORDER — IBUPROFEN 600 MG PO TABS: 600.0000 mg | ORAL_TABLET | Freq: Four times a day (QID) | ORAL | 0 refills | Status: DC | PRN

## 2016-07-01 MED ORDER — HYDROCODONE-ACETAMINOPHEN 5-325 MG PO TABS
1.0000 | ORAL_TABLET | Freq: Once | ORAL | Status: AC
  Administered 2016-07-01: 1 via ORAL
  Filled 2016-07-01: qty 1

## 2016-07-01 MED ORDER — TRAMADOL HCL 50 MG PO TABS: 50.0000 mg | ORAL_TABLET | Freq: Four times a day (QID) | ORAL | 0 refills | Status: DC | PRN

## 2016-07-01 NOTE — Progress Notes (Signed)
Orthopedic Tech Progress Note Patient Details:  Vincent West 1990/12/25 [id number]  Ortho Devices Type of Ortho Device: Crutches Ortho Device/Splint Interventions: Adjustment   Saul FordyceJennifer C Weronika Birch 07/01/2016, 4:57 PM
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
## 2016-07-01 NOTE — ED Notes (Signed)
Pt verbalized understanding of discharge instructions. Pt did not e-sign.

## 2016-07-01 NOTE — ED Notes (Signed)
Patient transported to X-ray 

## 2016-07-01 NOTE — Discharge Instructions (Signed)
Take your medications as prescribed as needed for pain relief. I recommend resting, elevating and applying ice to right foot for 15-20 minutes 3-4 times daily. He may also continue to wear the Ace wrap to right foot for comfort. He may use her crutches as needed for comfort however I recommend trying to bear weight on your foot it as soon as your pain has improved. Follow-up with orthopedic clinic listed below is your symptoms have not improved over the next 4-5 days. Please return to the Emergency Department if symptoms worsen or new onset of fever, redness, swelling, warmth, numbness, tingling, weakness, decreased range of motion.
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
## 2017-01-08 ENCOUNTER — Encounter (HOSPITAL_COMMUNITY): Payer: Self-pay | Admitting: Emergency Medicine

## 2017-01-08 ENCOUNTER — Emergency Department (HOSPITAL_COMMUNITY)
Admission: EM | Admit: 2017-01-08 | Discharge: 2017-01-08 | Disposition: A | Discharge status: Home / Self Care | Payer: No Typology Code available for payment source | Attending: Emergency Medicine | Admitting: Emergency Medicine

## 2017-01-08 DIAGNOSIS — Z87891 Personal history of nicotine dependence: Secondary | ICD-10-CM | POA: Diagnosis not present

## 2017-01-08 DIAGNOSIS — R110 Nausea: Secondary | ICD-10-CM | POA: Insufficient documentation

## 2017-01-08 DIAGNOSIS — R109 Unspecified abdominal pain: Secondary | ICD-10-CM | POA: Diagnosis not present

## 2017-01-08 DIAGNOSIS — Z5321 Procedure and treatment not carried out due to patient leaving prior to being seen by health care provider: Secondary | ICD-10-CM | POA: Diagnosis not present

## 2017-01-08 DIAGNOSIS — R197 Diarrhea, unspecified: Secondary | ICD-10-CM | POA: Diagnosis not present

## 2017-01-08 LAB — COMPREHENSIVE METABOLIC PANEL
ALK PHOS: 54 U/L (ref 38–126)
ALT: 12 U/L — ABNORMAL LOW (ref 17–63)
ANION GAP: 7 (ref 5–15)
AST: 22 U/L (ref 15–41)
Albumin: 4.1 g/dL (ref 3.5–5.0)
BILIRUBIN TOTAL: 0.5 mg/dL (ref 0.3–1.2)
BUN: 10 mg/dL (ref 6–20)
CALCIUM: 9.1 mg/dL (ref 8.9–10.3)
CO2: 26 mmol/L (ref 22–32)
Chloride: 104 mmol/L (ref 101–111)
Creatinine, Ser: 1.06 mg/dL (ref 0.61–1.24)
GFR calc non Af Amer: >60 mL/min (ref >60)
Glucose, Bld: 130 mg/dL — ABNORMAL HIGH (ref 65–99)
POTASSIUM: 3.8 mmol/L (ref 3.5–5.1)
SODIUM: 137 mmol/L (ref 135–145)
TOTAL PROTEIN: 7 g/dL (ref 6.5–8.1)

## 2017-01-08 LAB — CBC
HEMATOCRIT: 40.2 % (ref 39.0–52.0)
HEMOGLOBIN: 13.9 g/dL (ref 13.0–17.0)
MCH: 29.3 pg (ref 26.0–34.0)
MCHC: 34.6 g/dL (ref 30.0–36.0)
MCV: 84.6 fL (ref 78.0–100.0)
Platelets: 282 10*3/uL (ref 150–400)
RBC: 4.75 MIL/uL (ref 4.22–5.81)
RDW: 12.8 % (ref 11.5–15.5)
WBC: 5.3 10*3/uL (ref 4.0–10.5)

## 2017-01-08 LAB — LIPASE, BLOOD: Lipase: 23 U/L (ref 11–51)

## 2017-01-08 NOTE — ED Notes (Signed)
Patient no answer in lobby

## 2017-01-08 NOTE — ED Notes (Signed)
Pt called to be taken to room x3 with no answer.  

## 2017-01-08 NOTE — ED Notes (Signed)
Pt called to obtain vitals x3 with no answer.  

## 2017-01-08 NOTE — ED Notes (Signed)
Pt c/o n/v/d x2 days with abdominal pain

## 2017-01-08 NOTE — ED Triage Notes (Signed)
The pt keeps coming and going he has been called x2 2 more separate times  gone

## 2017-01-08 NOTE — ED Notes (Signed)
Pt came up to nursing station stating he had stepped away for 30 min. Asked if his name had been called, informed pt it had been called multiple times.

## 2017-01-08 NOTE — ED Notes (Signed)
Pt called to be taken to room and pt did not answer x3.

## 2017-01-09 ENCOUNTER — Ambulatory Visit (HOSPITAL_COMMUNITY)
Admission: EM | Admit: 2017-01-09 | Discharge: 2017-01-09 | Disposition: A | Discharge status: Home / Self Care | Payer: Self-pay | Attending: Internal Medicine | Admitting: Internal Medicine

## 2017-01-09 ENCOUNTER — Encounter (HOSPITAL_COMMUNITY): Payer: Self-pay | Admitting: Emergency Medicine

## 2017-01-09 DIAGNOSIS — R1084 Generalized abdominal pain: Secondary | ICD-10-CM

## 2017-01-09 DIAGNOSIS — K5901 Slow transit constipation: Secondary | ICD-10-CM

## 2017-01-09 DIAGNOSIS — R197 Diarrhea, unspecified: Secondary | ICD-10-CM

## 2017-01-09 DIAGNOSIS — R112 Nausea with vomiting, unspecified: Secondary | ICD-10-CM

## 2017-01-09 NOTE — Discharge Instructions (Signed)
The history and physical is more consistent with constipation rather than a viral type of gastroenteritis. You have been eating quite a bit but not having sufficient bowel movements. Recommend using MiraLAX as directed. Increase her fluid intake and fiber intake as well.

## 2017-01-09 NOTE — ED Triage Notes (Signed)
The patient presented to the [facility] with a complaint of abdominal cramping with N/V/D x 4 days. 

## 2017-01-09 NOTE — ED Provider Notes (Signed)
CSN: [id number]     Arrival date & time 01/09/17  1719 History   First MD Initiated Contact with Patient 01/09/17 1846     Chief Complaint  Patient presents with  . Abdominal Pain   (Consider location/radiation/quality/duration/timing/severity/associated sxs/prior Treatment) 26 year old male complaining of vomiting about twice a day and diarrhea for 2-3 days. He states prior to the onset of vomiting he had been constipated. He only feels nauseated or has vomiting when he has an empty stomach. He feels as though he is always hungry and he "eats a lot". He has 2-3 episodes of small amount of diarrhea or very loose stools per day. Occasionally he will he will get a sharp pain across the upper abdomen. It tends to come and go.      Past Medical History:  Diagnosis Date  . Asthma    History reviewed. No pertinent surgical history. History reviewed. No pertinent family history. Social History  Substance Use Topics  . Smoking status: Former Smoker    Packs/day: 0.00    Types: Cigarettes    Quit date: 04/01/2016  . Smokeless tobacco: Never Used  . Alcohol use Yes     Comment: occ     Review of Systems  HENT: Negative.   Gastrointestinal: Positive for abdominal distention, abdominal pain, constipation, diarrhea, nausea and vomiting. Negative for blood in stool.  Genitourinary: Negative.   Skin: Negative.   All other systems reviewed and are negative.   Allergies  Patient has no known allergies.  Home Medications   Prior to Admission medications   Not on File   Meds Ordered and Administered this Visit  Medications - No data to display  BP 129/60 (BP Location: Right Arm)   Pulse 70   Temp 97.8 F (36.6 C) (Oral)   Resp 18   SpO2 99%  No data found.   Physical Exam  Constitutional: He is oriented to person, place, and time. He appears well-nourished. No distress.  Neck: Normal range of motion. Neck supple.  Cardiovascular: Normal rate, regular rhythm and normal heart  sounds.   Pulmonary/Chest: Effort normal and breath sounds normal. No respiratory distress.  Abdominal: Bowel sounds are normal. He exhibits distension.  Abdomen mildly distended. Most of the abdomen percusses tympanic. Across the lower abdomen flat to dull. Minor tenderness in the left lower quadrant otherwise nontender. No rebound or guarding. No mass or hernia.  Musculoskeletal: Normal range of motion.  Neurological: He is alert and oriented to person, place, and time.  Skin: Skin is warm and dry.  Psychiatric: He has a normal mood and affect.  Nursing note and vitals reviewed.   Urgent Care Course     Procedures (including critical care time)  Labs Review Labs Reviewed - No data to display  Imaging Review No results found.   Visual Acuity Review  Right Eye Distance:   Left Eye Distance:   Bilateral Distance:    Right Eye Near:   Left Eye Near:    Bilateral Near:         MDM   1. Generalized abdominal pain   2. Slow transit constipation   3. Nausea vomiting and diarrhea    The history and physical is more consistent with constipation rather than a viral type of gastroenteritis. You have been eating quite a bit but not having sufficient bowel movements. Recommend using MiraLAX as directed. Increase her fluid intake and fiber intake as well.     Hayden RasmussenMabe, Gagandeep Pettet, NP 01/09/17 1906

## 2017-03-15 ENCOUNTER — Encounter (HOSPITAL_COMMUNITY): Payer: Self-pay | Admitting: Emergency Medicine

## 2017-03-15 ENCOUNTER — Emergency Department (HOSPITAL_COMMUNITY)
Admission: EM | Admit: 2017-03-15 | Discharge: 2017-03-15 | Disposition: A | Discharge status: Home / Self Care | Payer: Self-pay | Attending: Emergency Medicine | Admitting: Emergency Medicine

## 2017-03-15 DIAGNOSIS — Z87891 Personal history of nicotine dependence: Secondary | ICD-10-CM | POA: Insufficient documentation

## 2017-03-15 DIAGNOSIS — Z202 Contact with and (suspected) exposure to infections with a predominantly sexual mode of transmission: Secondary | ICD-10-CM | POA: Insufficient documentation

## 2017-03-15 DIAGNOSIS — J45909 Unspecified asthma, uncomplicated: Secondary | ICD-10-CM | POA: Insufficient documentation

## 2017-03-15 MED ORDER — STERILE WATER FOR INJECTION IJ SOLN
INTRAMUSCULAR | Status: AC
  Filled 2017-03-15: qty 10

## 2017-03-15 MED ORDER — AZITHROMYCIN 250 MG PO TABS
1000.0000 mg | ORAL_TABLET | Freq: Once | ORAL | Status: AC
  Administered 2017-03-15: 1000 mg via ORAL
  Filled 2017-03-15: qty 4

## 2017-03-15 MED ORDER — CEFTRIAXONE SODIUM 250 MG IJ SOLR
250.0000 mg | Freq: Once | INTRAMUSCULAR | Status: AC
  Administered 2017-03-15: 250 mg via INTRAMUSCULAR
  Filled 2017-03-15: qty 250
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
## 2017-03-15 NOTE — ED Triage Notes (Signed)
Pt sts was exposed to chlamydia

## 2017-03-15 NOTE — Discharge Instructions (Signed)
Please read attached information. If you experience any new or worsening signs or symptoms please return to the emergency room for evaluation. Please follow-up with your primary care provider or specialist as discussed.  °

## 2017-03-16 LAB — GC/CHLAMYDIA PROBE AMP (~~LOC~~) NOT AT ARMC
Chlamydia: NEGATIVE
Neisseria Gonorrhea: NEGATIVE

## 2020-04-28 ENCOUNTER — Encounter (HOSPITAL_COMMUNITY): Payer: Self-pay

## 2020-04-28 ENCOUNTER — Ambulatory Visit (HOSPITAL_COMMUNITY)
Admission: EM | Admit: 2020-04-28 | Discharge: 2020-04-28 | Disposition: A | Discharge status: Home / Self Care | Payer: Self-pay | Attending: Family Medicine | Admitting: Family Medicine

## 2020-04-28 ENCOUNTER — Other Ambulatory Visit: Payer: Self-pay

## 2020-04-28 DIAGNOSIS — R369 Urethral discharge, unspecified: Secondary | ICD-10-CM | POA: Insufficient documentation

## 2020-04-28 NOTE — ED Triage Notes (Signed)
Pt presents with irritation on his penis and discharge coming from penis since Saturday.

## 2020-04-28 NOTE — ED Provider Notes (Signed)
[facility]    CSN: [id number] Arrival date & time: 04/28/20  1546      History   Chief Complaint Chief Complaint  Patient presents with  . STD Exposure    HPI Ersel Enslin is a 29 y.o. male.   Here today concerned about 2 days of penile irritation and discharge. He denies any fever, pelvic pain, dysuria, hematuria, rashes. One recent sexual partner but unsure if she was infected with anything. Has not tried anything OTC for sxs. Has had an STI in the past but states it was about 10 years ago.      Past Medical History:  Diagnosis Date  . Asthma     There are no problems to display for this patient.   History reviewed. No pertinent surgical history.     Home Medications    Prior to Admission medications   Not on File    Family History Family History  Family history unknown: Yes    Social History Social History   Tobacco Use  . Smoking status: Former Smoker    Packs/day: 0.00    Types: Cigarettes    Quit date: 04/01/2016    Years since quitting: 4.0  . Smokeless tobacco: Never Used  Substance Use Topics  . Alcohol use: Yes    Comment: occ   . Drug use: Yes    Types: Marijuana    Comment: molly     Allergies   Patient has no known allergies.   Review of Systems Review of Systems PER HPI    Physical Exam Triage Vital Signs ED Triage Vitals  Enc Vitals Group     BP 04/28/20 1724 122/80     Pulse Rate 04/28/20 1724 83     Resp 04/28/20 1724 18     Temp 04/28/20 1724 98.5 F (36.9 C)     Temp Source 04/28/20 1724 Oral     SpO2 04/28/20 1724 94 %     Weight --      Height --      Head Circumference --      Peak Flow --      Pain Score 04/28/20 1722 2     Pain Loc --      Pain Edu? --      Excl. in GC? --    No data found.  Updated Vital Signs BP 122/80 (BP Location: Right Arm)   Pulse 83   Temp 98.5 F (36.9 C) (Oral)   Resp 18   SpO2 94%   Visual Acuity Right Eye Distance:   Left Eye Distance:     Bilateral Distance:    Right Eye Near:   Left Eye Near:    Bilateral Near:     Physical Exam Vitals and nursing note reviewed.  Constitutional:      Appearance: Normal appearance.  HENT:     Head: Atraumatic.  Eyes:     Extraocular Movements: Extraocular movements intact.     Conjunctiva/sclera: Conjunctivae normal.  Cardiovascular:     Rate and Rhythm: Normal rate and regular rhythm.  Pulmonary:     Effort: Pulmonary effort is normal.     Breath sounds: Normal breath sounds.  Abdominal:     General: Bowel sounds are normal. There is no distension.     Palpations: Abdomen is soft.     Tenderness: There is no abdominal tenderness. There is no right CVA tenderness, left CVA tenderness or guarding.  Genitourinary:    Comments: He declines  GU exam today. Self swab performed Musculoskeletal:        General: Normal range of motion.     Cervical back: Normal range of motion and neck supple.  Skin:    General: Skin is warm and dry.  Neurological:     General: No focal deficit present.     Mental Status: He is oriented to person, place, and time.  Psychiatric:        Mood and Affect: Mood normal.        Thought Content: Thought content normal.        Judgment: Judgment normal.    UC Treatments / Results  Labs (all labs ordered are listed, but only abnormal results are displayed) Labs Reviewed  CYTOLOGY, (ORAL, ANAL, URETHRAL) ANCILLARY ONLY    EKG   Radiology No results found.  Procedures Procedures (including critical care time)  Medications Ordered in UC Medications - No data to display  Initial Impression / Assessment and Plan / UC Course  I have reviewed the triage vital signs and the nursing notes.  Pertinent labs & imaging results that were available during my care of the patient were reviewed by me and considered in my medical decision making (see chart for details).     Declines HIV or syphilis testing today, penile swab collected and results  pending. Will treat based on these results. Discussed safe sexual practices and to avoid any sexual contact until test results back and treatment is completed as applicable. Work note given that he was seen today.    Final Clinical Impressions(s) / UC Diagnoses   Final diagnoses:  Penile discharge   Discharge Instructions   None    ED Prescriptions    None     PDMP not reviewed this encounter.   Particia Nearing, [place] 04/28/20 1904

## 2020-04-29 LAB — CYTOLOGY, (ORAL, ANAL, URETHRAL) ANCILLARY ONLY
Chlamydia: NEGATIVE
Comment: NEGATIVE
Comment: NEGATIVE
Comment: NORMAL
Neisseria Gonorrhea: POSITIVE — AB
Trichomonas: NEGATIVE

## 2020-04-30 ENCOUNTER — Other Ambulatory Visit: Payer: Self-pay

## 2020-04-30 ENCOUNTER — Ambulatory Visit (HOSPITAL_COMMUNITY)
Admission: EM | Admit: 2020-04-30 | Discharge: 2020-04-30 | Disposition: A | Discharge status: Home / Self Care | Payer: Self-pay | Attending: Internal Medicine | Admitting: Internal Medicine

## 2020-04-30 DIAGNOSIS — A549 Gonococcal infection, unspecified: Secondary | ICD-10-CM

## 2020-04-30 MED ORDER — CEFTRIAXONE SODIUM 500 MG IJ SOLR
INTRAMUSCULAR | Status: AC
  Filled 2020-04-30: qty 500

## 2020-04-30 MED ORDER — CEFTRIAXONE SODIUM 500 MG IJ SOLR
500.0000 mg | Freq: Once | INTRAMUSCULAR | Status: AC
  Administered 2020-04-30: 500 mg via INTRAMUSCULAR

## 2020-04-30 NOTE — ED Notes (Signed)
Patient able to ambulate independently  

## 2020-04-30 NOTE — ED Triage Notes (Signed)
Patient presents to [facility] for treatment for recent positive Gonorrhea.

## 2020-07-29 ENCOUNTER — Other Ambulatory Visit: Payer: Self-pay

## 2020-07-29 ENCOUNTER — Encounter (HOSPITAL_COMMUNITY): Payer: Self-pay | Admitting: Emergency Medicine

## 2020-07-29 ENCOUNTER — Ambulatory Visit (HOSPITAL_COMMUNITY)
Admission: EM | Admit: 2020-07-29 | Discharge: 2020-07-29 | Disposition: A | Discharge status: Home / Self Care | Payer: HRSA Program | Attending: Urgent Care | Admitting: Urgent Care

## 2020-07-29 DIAGNOSIS — Z87891 Personal history of nicotine dependence: Secondary | ICD-10-CM | POA: Diagnosis not present

## 2020-07-29 DIAGNOSIS — U071 COVID-19: Secondary | ICD-10-CM | POA: Diagnosis not present

## 2020-07-29 DIAGNOSIS — J069 Acute upper respiratory infection, unspecified: Secondary | ICD-10-CM | POA: Diagnosis not present

## 2020-07-29 DIAGNOSIS — Z8709 Personal history of other diseases of the respiratory system: Secondary | ICD-10-CM

## 2020-07-29 DIAGNOSIS — R110 Nausea: Secondary | ICD-10-CM | POA: Insufficient documentation

## 2020-07-29 DIAGNOSIS — B349 Viral infection, unspecified: Secondary | ICD-10-CM | POA: Insufficient documentation

## 2020-07-29 MED ORDER — CETIRIZINE HCL 10 MG PO TABS: 10.0000 mg | ORAL_TABLET | Freq: Every day | ORAL | 0 refills | Status: DC

## 2020-07-29 MED ORDER — PSEUDOEPHEDRINE HCL 60 MG PO TABS: 60.0000 mg | ORAL_TABLET | Freq: Three times a day (TID) | ORAL | 0 refills | Status: DC | PRN

## 2020-07-29 MED ORDER — ALBUTEROL SULFATE HFA 108 (90 BASE) MCG/ACT IN AERS: 1.0000 | INHALATION_SPRAY | Freq: Four times a day (QID) | RESPIRATORY_TRACT | 0 refills | Status: DC | PRN

## 2020-07-29 MED ORDER — BENZONATATE 100 MG PO CAPS: 100.0000 mg | ORAL_CAPSULE | Freq: Three times a day (TID) | ORAL | 0 refills | Status: DC | PRN

## 2020-07-29 MED ORDER — PROMETHAZINE-DM 6.25-15 MG/5ML PO SYRP: 5.0000 mL | ORAL_SOLUTION | Freq: Every evening | ORAL | 0 refills | Status: DC | PRN

## 2020-07-29 NOTE — Discharge Instructions (Addendum)

## 2020-07-29 NOTE — ED Triage Notes (Signed)
Started feeling bad Sunday morning.  Patient has a headache, itchy throat, slight cough, intermittent fever, general body aches, nausea, no vomiting.

## 2020-07-29 NOTE — ED Provider Notes (Addendum)
[facility]   MRN: [id number] DOB: [birth year]  Subjective:   Vincent West is a 30 y.o. male presenting for 3 day history of acute onset malaise and fatigue. Has had scratchy throat, body aches, mild cough, subjective fever, generalized headaches, nausea without vomiting, chest tightness. Has a history of asthma. Denies chest pain, shob. He is a smoker.   No current facility-administered medications for this encounter. No current outpatient medications on file.   No Known Allergies  Past Medical History:  Diagnosis Date  . Asthma      History reviewed. No pertinent surgical history.  Family History  Problem Relation Age of Onset  . Healthy Mother     Social History   Tobacco Use  . Smoking status: Former Smoker    Packs/day: 0.00    Types: Cigarettes    Quit date: 04/01/2016    Years since quitting: 4.3  . Smokeless tobacco: Never Used  Substance Use Topics  . Alcohol use: Yes    Comment: occ   . Drug use: Not Currently    Types: Marijuana    Comment: molly    ROS   Objective:   Vitals: BP 114/76 (BP Location: Right Arm)   Pulse 76   Temp 98 F (36.7 C) (Oral)   Resp 18   SpO2 96%   Physical Exam Constitutional:      General: He is not in acute distress.    Appearance: Normal appearance. He is well-developed. He is not ill-appearing, toxic-appearing or diaphoretic.  HENT:     Head: Normocephalic and atraumatic.     Right Ear: External ear normal.     Left Ear: External ear normal.     Nose: Nose normal.     Mouth/Throat:     Mouth: Mucous membranes are moist.     Pharynx: No oropharyngeal exudate or posterior oropharyngeal erythema.  Eyes:     General: No scleral icterus.       Right eye: No discharge.        Left eye: No discharge.     Extraocular Movements: Extraocular movements intact.     Conjunctiva/sclera: Conjunctivae normal.     Pupils: Pupils are equal, round, and reactive to light.  Cardiovascular:     Rate and  Rhythm: Normal rate and regular rhythm.     Heart sounds: Normal heart sounds. No murmur heard. No friction rub. No gallop.   Pulmonary:     Effort: Pulmonary effort is normal. No respiratory distress.     Breath sounds: Normal breath sounds. No stridor. No wheezing, rhonchi or rales.  Neurological:     Mental Status: He is alert and oriented to person, place, and time.  Psychiatric:        Mood and Affect: Mood normal.        Behavior: Behavior normal.        Thought Content: Thought content normal.        Judgment: Judgment normal.     Assessment and Plan :   PDMP not reviewed this encounter.  1. Viral syndrome   2. History of asthma     Will manage for viral illness such as viral URI, viral syndrome, viral rhinitis, COVID-19. Counseled patient on nature of COVID-19 including modes of transmission, diagnostic testing, management and supportive care.  Offered scripts for symptomatic relief. COVID 19 testing is pending. Refilled his albuterol inhaler. Counseled patient on potential for adverse effects with medications prescribed/recommended today, ER and return-to-clinic precautions  discussed, patient verbalized understanding.     Wallis Bamberg, [place] 07/29/20 5462

## 2020-07-30 LAB — SARS CORONAVIRUS 2 (TAT 6-24 HRS): SARS Coronavirus 2: POSITIVE — AB

## 2020-09-30 ENCOUNTER — Encounter (HOSPITAL_COMMUNITY): Payer: Self-pay

## 2020-09-30 ENCOUNTER — Emergency Department (HOSPITAL_COMMUNITY)
Admission: EM | Admit: 2020-09-30 | Discharge: 2020-09-30 | Disposition: A | Discharge status: Home / Self Care | Payer: Self-pay | Attending: Emergency Medicine | Admitting: Emergency Medicine

## 2020-09-30 ENCOUNTER — Other Ambulatory Visit: Payer: Self-pay

## 2020-09-30 DIAGNOSIS — J069 Acute upper respiratory infection, unspecified: Secondary | ICD-10-CM

## 2020-09-30 DIAGNOSIS — Z8616 Personal history of COVID-19: Secondary | ICD-10-CM | POA: Insufficient documentation

## 2020-09-30 DIAGNOSIS — Z87891 Personal history of nicotine dependence: Secondary | ICD-10-CM | POA: Insufficient documentation

## 2020-09-30 DIAGNOSIS — J029 Acute pharyngitis, unspecified: Secondary | ICD-10-CM | POA: Insufficient documentation

## 2020-09-30 DIAGNOSIS — J45909 Unspecified asthma, uncomplicated: Secondary | ICD-10-CM | POA: Insufficient documentation

## 2020-09-30 DIAGNOSIS — J351 Hypertrophy of tonsils: Secondary | ICD-10-CM

## 2020-09-30 LAB — GROUP A STREP BY PCR: Group A Strep by PCR: NOT DETECTED

## 2020-09-30 MED ORDER — LIDOCAINE VISCOUS HCL 2 % MT SOLN
15.0000 mL | Freq: Once | OROMUCOSAL | Status: AC
  Administered 2020-09-30: 15 mL via OROMUCOSAL
  Filled 2020-09-30: qty 15

## 2020-09-30 MED ORDER — LIDOCAINE VISCOUS HCL 2 % MT SOLN: 15.0000 mL | OROMUCOSAL | 0 refills | Status: DC | PRN

## 2020-09-30 MED ORDER — KETOROLAC TROMETHAMINE 30 MG/ML IJ SOLN
30.0000 mg | Freq: Once | INTRAMUSCULAR | Status: AC
  Administered 2020-09-30: 30 mg via INTRAMUSCULAR
  Filled 2020-09-30: qty 1

## 2020-09-30 NOTE — Discharge Instructions (Signed)
Thank you for allowing me to care for you today in the Emergency Department.   You were seen today for a sore throat, cough, headache, and sneezing.  Your symptoms are most consistent with a viral illness.  We did test you for strep throat today and it was negative.  Since this test was negative, you do not need antibiotics for your sore throat since it is most likely a virus.  To treat your symptoms:  You can swallow 15 mL of viscous lidocaine every 3 hours as needed for sore throat.  Take 650 mg of Tylenol or 600 mg of ibuprofen with food every 6 hours for pain.  You can alternate between these 2 medications every 3 hours if your pain returns.  For instance, you can take Tylenol at noon, followed by a dose of ibuprofen at 3, followed by second dose of Tylenol and 6.  You can gargle warm salt water every 6 hours to help with your pain.  Eating and drinking hot and cold beverages and food may be easier to swallow until your sore throat improves.  Your tonsils were enlarged today.  I would recommend getting established with a primary care provider to have them recheck this.  You can call the number on your discharge paperwork to get established with a primary care provider.  Return to the emergency department if you develop drooling, fever, unable to open your mouth, develop respiratory distress, feel as if your throat is closing, if you pass out, or have other new, concerning symptoms.

## 2020-09-30 NOTE — ED Provider Notes (Signed)
[facility] EMERGENCY DEPARTMENT Provider Note   CSN: [id number] Arrival date & time: 09/30/20  0117     History Chief Complaint  Patient presents with  . Sore Throat    Vincent West is a 30 y.o. male with a history of asthma who presents the emergency department with a chief complaint of sore throat.  The patient endorses 48 hours of bilateral sore throat, nonproductive cough, sneezing, fatigue, and headache.  He denies fever, chills, dental pain, nasal congestion, rhinorrhea, otalgia, chest pain, shortness of breath, nausea, vomiting, diarrhea, abdominal pain.  He took over-the-counter medication for his headache earlier with significant improvement, but his sore throat has not improved.  Pain is worse with swallowing.  No other known aggravating or alleviating factors.  However, he has been able to eat and drink today.  No known sick contacts.  The patient does have a history of asthma, but denies any recent wheezing.  No itchy watery eyes.  No other treatment prior to arrival.  No known sick contacts.  The patient had COVID-19 in January 2022.  The history is provided by the patient and medical records. No language interpreter was used.       Past Medical History:  Diagnosis Date  . Asthma     There are no problems to display for this patient.   History reviewed. No pertinent surgical history.     Family History  Problem Relation Age of Onset  . Healthy Mother     Social History   Tobacco Use  . Smoking status: Former Smoker    Packs/day: 0.00    Types: Cigarettes    Quit date: 04/01/2016    Years since quitting: 4.5  . Smokeless tobacco: Never Used  Substance Use Topics  . Alcohol use: Yes    Comment: occ   . Drug use: Not Currently    Types: Marijuana    Comment: molly    Home Medications Prior to Admission medications   Medication Sig Start Date End Date Taking? Authorizing Provider  lidocaine (XYLOCAINE) 2 % solution Use as  directed 15 mLs in the mouth or throat every 3 (three) hours as needed for mouth pain. 09/30/20  Yes Suhaas Agena A, PA-C  albuterol (VENTOLIN HFA) 108 (90 Base) MCG/ACT inhaler Inhale 1-2 puffs into the lungs every 6 (six) hours as needed for wheezing or shortness of breath. 07/29/20   Wallis Bamberg, PA-C  benzonatate (TESSALON) 100 MG capsule Take 1-2 capsules (100-200 mg total) by mouth 3 (three) times daily as needed for cough. 07/29/20   Wallis Bamberg, PA-C  cetirizine (ZYRTEC ALLERGY) 10 MG tablet Take 1 tablet (10 mg total) by mouth daily. 07/29/20   Wallis Bamberg, PA-C  promethazine-dextromethorphan (PROMETHAZINE-DM) 6.25-15 MG/5ML syrup Take 5 mLs by mouth at bedtime as needed for cough. 07/29/20   Wallis Bamberg, PA-C  pseudoephedrine (SUDAFED) 60 MG tablet Take 1 tablet (60 mg total) by mouth every 8 (eight) hours as needed for congestion. 07/29/20   Wallis Bamberg, PA-C    Allergies    Patient has no known allergies.  Review of Systems   Review of Systems  Constitutional: Positive for fatigue. Negative for appetite change and fever.  HENT: Positive for sneezing and sore throat. Negative for congestion, ear discharge, ear pain, facial swelling and rhinorrhea.   Eyes: Negative for visual disturbance.  Respiratory: Positive for cough. Negative for shortness of breath and wheezing.   Cardiovascular: Negative for chest pain and palpitations.  Gastrointestinal: Negative for  abdominal pain, diarrhea, nausea and vomiting.  Genitourinary: Negative for dysuria.  Musculoskeletal: Negative for back pain.  Skin: Negative for rash.  Allergic/Immunologic: Negative for immunocompromised state.  Neurological: Positive for headaches. Negative for dizziness, seizures, syncope, weakness, light-headedness and numbness.  Psychiatric/Behavioral: Negative for confusion.    Physical Exam Updated Vital Signs BP (!) 151/96 (BP Location: Right Arm)   Pulse 69   Temp 98.4 F (36.9 C) (Oral)   Resp 15   SpO2 98%    Physical Exam Vitals and nursing note reviewed.  Constitutional:      Appearance: He is well-developed.  HENT:     Head: Normocephalic.     Right Ear: Tympanic membrane, ear canal and external ear normal.     Left Ear: Tympanic membrane, ear canal and external ear normal.     Nose: Congestion present. No rhinorrhea.     Mouth/Throat:     Mouth: Mucous membranes are moist.     Pharynx: No oropharyngeal exudate or posterior oropharyngeal erythema.     Tonsils: 2+ on the right. 2+ on the left.     Comments: Uvula is midline.  No exudates or erythema.  No sublingual edema.  Tolerating secretions without difficulty.  Patient is able to speak in complete, fluent sentences.  No trismus. Eyes:     Conjunctiva/sclera: Conjunctivae normal.  Neck:     Comments: No cervical lymphadenopathy.  No meningismus. Cardiovascular:     Rate and Rhythm: Normal rate and regular rhythm.     Pulses: Normal pulses.     Heart sounds: Normal heart sounds. No murmur heard. No friction rub. No gallop.   Pulmonary:     Effort: Pulmonary effort is normal. No respiratory distress.     Breath sounds: No stridor. No wheezing, rhonchi or rales.  Chest:     Chest wall: No tenderness.  Abdominal:     General: There is no distension.     Palpations: Abdomen is soft.     Tenderness: There is no abdominal tenderness.  Musculoskeletal:     Cervical back: Neck supple.  Skin:    General: Skin is warm and dry.  Neurological:     Mental Status: He is alert.  Psychiatric:        Behavior: Behavior normal.     ED Results / Procedures / Treatments   Labs (all labs ordered are listed, but only abnormal results are displayed) Labs Reviewed  GROUP A STREP BY PCR    EKG None  Radiology No results found.  Procedures Procedures   Medications Ordered in ED Medications  ketorolac (TORADOL) 30 MG/ML injection 30 mg (30 mg Intramuscular Given 09/30/20 0333)  lidocaine (XYLOCAINE) 2 % viscous mouth solution 15  mL (15 mLs Mouth/Throat Given 09/30/20 0175)    ED Course  I have reviewed the triage vital signs and the nursing notes.  Pertinent labs & imaging results that were available during my care of the patient were reviewed by me and considered in my medical decision making (see chart for details).    MDM Rules/Calculators/A&P                          30 year old male who presents to the emergency department with 48 hours of headache, fatigue, sore throat, sneezing, nonproductive cough.  Headache improved with over-the-counter medication.  No constitutional symptoms.  The patient had COVID-19 in January 2022.  Vital signs are negative.  Physical exam is reassuring, but patient  did have 2+ tonsillar hypertrophy bilaterally.  No exudates.  Posterior oropharynx is patent.  Patient is unsure if this is chronic.  We will have him follow-up with primary care for reevaluation of tonsillar hypertrophy.  Labs have been reviewed and independently interpreted by me.  Strep PCR test is negative.  No indication for COVID-19 test since patient had Covid less than 60 days ago.  I suspect the patient has a viral URI, but also considered allergies, mononucleosis, oropharyngeal STI, dentalgia, Ludwick's angina, peritonsillar abscess, retropharyngeal abscess, or meningitis.  Pain was treated with Toradol and viscous lidocaine in the ER.  At this time, the patient is hemodynamically stable and in no acute distress.  Will send home with an Rx for viscous lidocaine and outpatient follow-up to have the patient's symptoms rechecked with primary care.  He is in agreement with this plan.  Safe discharge to home at this time.  Final Clinical Impression(s) / ED Diagnoses Final diagnoses:  Viral URI with cough  Hypertrophy of tonsils    Rx / DC Orders ED Discharge Orders         Ordered    lidocaine (XYLOCAINE) 2 % solution  Every  3 hours PRN        09/30/20 0332           Frederik Pear A, PA-C 09/30/20 0417     Nira Conn, MD 09/30/20 1758

## 2020-09-30 NOTE — ED Triage Notes (Signed)
Pt reports that he has been having sore throat and headaches since Saturday, denies fevers

## 2020-11-18 ENCOUNTER — Other Ambulatory Visit: Payer: Self-pay

## 2020-11-18 ENCOUNTER — Encounter (HOSPITAL_COMMUNITY): Payer: Self-pay

## 2020-11-18 ENCOUNTER — Emergency Department (HOSPITAL_COMMUNITY)
Admission: EM | Admit: 2020-11-18 | Discharge: 2020-11-18 | Disposition: A | Discharge status: Home / Self Care | Payer: Self-pay | Attending: Emergency Medicine | Admitting: Emergency Medicine

## 2020-11-18 DIAGNOSIS — R300 Dysuria: Secondary | ICD-10-CM | POA: Insufficient documentation

## 2020-11-18 DIAGNOSIS — R369 Urethral discharge, unspecified: Secondary | ICD-10-CM | POA: Insufficient documentation

## 2020-11-18 DIAGNOSIS — Z202 Contact with and (suspected) exposure to infections with a predominantly sexual mode of transmission: Secondary | ICD-10-CM | POA: Insufficient documentation

## 2020-11-18 DIAGNOSIS — Z87891 Personal history of nicotine dependence: Secondary | ICD-10-CM | POA: Insufficient documentation

## 2020-11-18 DIAGNOSIS — J45909 Unspecified asthma, uncomplicated: Secondary | ICD-10-CM | POA: Insufficient documentation

## 2020-11-18 LAB — URINALYSIS, ROUTINE W REFLEX MICROSCOPIC
Bacteria, UA: NONE SEEN
Bilirubin Urine: NEGATIVE
Glucose, UA: NEGATIVE mg/dL
Hgb urine dipstick: NEGATIVE
Ketones, ur: NEGATIVE mg/dL
Nitrite: NEGATIVE
Protein, ur: NEGATIVE mg/dL
Specific Gravity, Urine: 1.03 (ref 1.005–1.030)
WBC, UA: >50 WBC/hpf — ABNORMAL HIGH (ref 0–5)
pH: 5 (ref 5.0–8.0)

## 2020-11-18 LAB — GC/CHLAMYDIA PROBE AMP (~~LOC~~) NOT AT ARMC
Chlamydia: NEGATIVE
Comment: NEGATIVE
Comment: NORMAL
Neisseria Gonorrhea: POSITIVE — AB

## 2020-11-18 MED ORDER — CEFTRIAXONE SODIUM 500 MG IJ SOLR
500.0000 mg | Freq: Once | INTRAMUSCULAR | Status: AC
  Administered 2020-11-18: 500 mg via INTRAMUSCULAR
  Filled 2020-11-18: qty 500

## 2020-11-18 MED ORDER — AZITHROMYCIN 250 MG PO TABS
1000.0000 mg | ORAL_TABLET | Freq: Once | ORAL | Status: AC
  Administered 2020-11-18: 1000 mg via ORAL
  Filled 2020-11-18: qty 4

## 2020-11-18 NOTE — ED Triage Notes (Signed)
Emergency Medicine Provider Triage Evaluation Note  Holger Sokolowski , a 30 y.o. male  was evaluated in triage.  Pt complains of concern for STD. Has penile discharge, discomfort when urinate. Onset 2-3 days.  Review of Systems  Positive: Dysuria, penile discharge  Negative: Fever, chills, abdominal pain, testicular pain or swelling  Physical Exam  BP (!) 125/100 (BP Location: Right Arm)   Pulse 86   Temp 98.9 F (37.2 C)   Resp 17   SpO2 96%  Gen:   Awake, no distress   HEENT:  Atraumatic  Resp:  Normal effort  Cardiac:  Normal rate  Abd:   Nondistended, nontender  MSK:   Moves extremities without difficulty  Neuro:  Speech clear  Medical Decision Making  Medically screening exam initiated at 5:44 AM.  Appropriate orders placed.  Kirby Cortese was informed that the remainder of the evaluation will be completed by another provider, this initial triage assessment does not replace that evaluation, and the importance of remaining in the ED until their evaluation is complete.  Clinical Impression   Urinalysis for STD testing ordered. Medicines ordered.    Liberty Handy, PA-C 11/18/20 [phone]

## 2020-11-18 NOTE — ED Triage Notes (Signed)
Pt reports partner test + for gonorrhea and he is coming tonight to get check,. Pt reports he is having burning with urination.

## 2020-11-18 NOTE — ED Provider Notes (Signed)
[facility] EMERGENCY DEPARTMENT Provider Note   CSN: [id number] Arrival date & time: 11/18/20  0516     History Chief Complaint  Patient presents with  . Exposure to STD    Vincent West is a 30 y.o. male presents to the ED for evaluation of of exposure to gonorrhea.  His partner tested positive for gonorrhea and notified him.  He has had discomfort with urination for the last 3 days and today developed penile discharge.  Denies associated fever, chills, abdominal pain, testicular pain or swelling.  No genital lesions.  No interventions.  HPI     Past Medical History:  Diagnosis Date  . Asthma     There are no problems to display for this patient.   History reviewed. No pertinent surgical history.     Family History  Problem Relation Age of Onset  . Healthy Mother     Social History   Tobacco Use  . Smoking status: Former Smoker    Packs/day: 0.00    Types: Cigarettes    Quit date: 04/01/2016    Years since quitting: 4.6  . Smokeless tobacco: Never Used  Substance Use Topics  . Alcohol use: Yes    Comment: occ   . Drug use: Not Currently    Types: Marijuana    Comment: molly    Home Medications Prior to Admission medications   Medication Sig Start Date End Date Taking? Authorizing Provider  albuterol (VENTOLIN HFA) 108 (90 Base) MCG/ACT inhaler Inhale 1-2 puffs into the lungs every 6 (six) hours as needed for wheezing or shortness of breath. 07/29/20   Wallis Bamberg, PA-C  benzonatate (TESSALON) 100 MG capsule Take 1-2 capsules (100-200 mg total) by mouth 3 (three) times daily as needed for cough. 07/29/20   Wallis Bamberg, PA-C  cetirizine (ZYRTEC ALLERGY) 10 MG tablet Take 1 tablet (10 mg total) by mouth daily. 07/29/20   Wallis Bamberg, PA-C  lidocaine (XYLOCAINE) 2 % solution Use as directed 15 mLs in the mouth or throat every 3 (three) hours as needed for mouth pain. 09/30/20   McDonald, Mia A, PA-C  promethazine-dextromethorphan (PROMETHAZINE-DM)  6.25-15 MG/5ML syrup Take 5 mLs by mouth at bedtime as needed for cough. 07/29/20   Wallis Bamberg, PA-C  pseudoephedrine (SUDAFED) 60 MG tablet Take 1 tablet (60 mg total) by mouth every 8 (eight) hours as needed for congestion. 07/29/20   Wallis Bamberg, PA-C    Allergies    Patient has no known allergies.  Review of Systems   Review of Systems  Genitourinary: Positive for difficulty urinating and penile discharge.  All other systems reviewed and are negative.   Physical Exam Updated Vital Signs BP (!) 125/100 (BP Location: Right Arm)   Pulse 86   Temp 98.9 F (37.2 C)   Resp 17   SpO2 96%   Physical Exam Vitals and nursing note reviewed.  Constitutional:      General: He is not in acute distress.    Appearance: He is well-developed.     Comments: NAD.  HENT:     Head: Normocephalic and atraumatic.     Right Ear: External ear normal.     Left Ear: External ear normal.     Nose: Nose normal.  Eyes:     General: No scleral icterus.    Conjunctiva/sclera: Conjunctivae normal.  Cardiovascular:     Rate and Rhythm: Normal rate and regular rhythm.     Heart sounds: Normal heart sounds. No murmur heard.  Pulmonary:     Effort: Pulmonary effort is normal.     Breath sounds: Normal breath sounds. No wheezing.  Abdominal:     Palpations: Abdomen is soft.  Genitourinary:    Comments:  External genitalia normal without erythema, edema, tenderness or lesions.  Circumcised male.  No groin lymphadenopathy.  No meatus discharge.  Glans and shaft smooth without tenderness, lesions, masses or deformity.  Scrotum without lesions or edema.  Non tender testicles. Epididymis and spermatic cord without tenderness or masses, bilaterally. Cremasteric reflex intact. Musculoskeletal:        General: No deformity. Normal range of motion.     Cervical back: Normal range of motion and neck supple.  Skin:    General: Skin is warm and dry.     Capillary Refill: Capillary refill takes less than  2 seconds.  Neurological:     Mental Status: He is alert and oriented to person, place, and time.  Psychiatric:        Behavior: Behavior normal.        Thought Content: Thought content normal.        Judgment: Judgment normal.     ED Results / Procedures / Treatments   Labs (all labs ordered are listed, but only abnormal results are displayed) Labs Reviewed  URINALYSIS, ROUTINE W REFLEX MICROSCOPIC - Abnormal; Notable for the following components:      Result Value   APPearance HAZY (*)    Leukocytes,Ua MODERATE (*)    WBC, UA >50 (*)    All other components within normal limits  GC/CHLAMYDIA PROBE AMP ([place]) NOT AT [facility]    EKG None  Radiology No results found.  Procedures Procedures   Medications Ordered in ED Medications  cefTRIAXone (ROCEPHIN) injection 500 mg (500 mg Intramuscular Given 11/18/20 0610)  azithromycin (ZITHROMAX) tablet 1,000 mg (1,000 mg Oral Given 11/18/20 2297)    ED Course  I have reviewed the triage vital signs and the nursing notes.  Pertinent labs & imaging results that were available during my care of the patient were reviewed by me and considered in my medical decision making (see chart for details).    MDM Rules/Calculators/A&P                          30 year old male presents to the ED for evaluation of exposure to gonorrhea.  He has discomfort with urination and penile discharge.  No red flags like fever, abdominal pain, flank pain, testicular pain or swelling.  Exam as above is unremarkable.  Urinalysis and GC/chlamydia probe ordered.  Given exposure and symptoms he was treated empirically with azithromycin and Rocephin.  Doubt UTI given clinical presentation.  Appropriate for discharge.  Discussed safe sex recommendations.  Return precautions discussed.  Patient is comfortable with this plan.  Final Clinical Impression(s) / ED Diagnoses Final diagnoses:  STD exposure    Rx / DC Orders ED Discharge Orders    None        Jerrell Mylar 11/18/20 0708    Zadie Rhine, MD 11/18/20 [phone]

## 2020-11-18 NOTE — Discharge Instructions (Addendum)
You were seen in the ER for concern for STD exposure  You were treated for gonorrhea and chlamydia.  Check on your test results on MyChart.  Do not have sexual encounters for the next 10 days after treatment. Notify all of your partners to get tested and treated   Return to the ED for worsening symptoms, testicular pain or swelling, fevers, chills, lower abdominal or flank pain

## 2021-02-09 ENCOUNTER — Emergency Department (HOSPITAL_COMMUNITY)
Admission: EM | Admit: 2021-02-09 | Discharge: 2021-02-09 | Disposition: A | Discharge status: Home / Self Care | Payer: Self-pay | Attending: Emergency Medicine | Admitting: Emergency Medicine

## 2021-02-09 ENCOUNTER — Other Ambulatory Visit: Payer: Self-pay

## 2021-02-09 ENCOUNTER — Encounter (HOSPITAL_COMMUNITY): Payer: Self-pay | Admitting: Emergency Medicine

## 2021-02-09 DIAGNOSIS — K149 Disease of tongue, unspecified: Secondary | ICD-10-CM | POA: Insufficient documentation

## 2021-02-09 DIAGNOSIS — Z87891 Personal history of nicotine dependence: Secondary | ICD-10-CM | POA: Insufficient documentation

## 2021-02-09 DIAGNOSIS — J45909 Unspecified asthma, uncomplicated: Secondary | ICD-10-CM | POA: Insufficient documentation

## 2021-02-09 DIAGNOSIS — M542 Cervicalgia: Secondary | ICD-10-CM | POA: Insufficient documentation

## 2021-02-09 MED ORDER — LIDOCAINE VISCOUS HCL 2 % MT SOLN: 15.0000 mL | OROMUCOSAL | 0 refills | Status: DC | PRN

## 2021-02-09 NOTE — ED Triage Notes (Addendum)
Pt states he feels like his tongue has a cut in the center and is swollen. Pt also states his neck feels swollen. Pt denies difficulty breathing or swallowing. Pt concerned he may be allergic to something, but is unsure what. Denies any hives or itching. Pts speech is not impaired by any tongue swelling. No drooling. Also complains of left ear pain

## 2021-02-09 NOTE — ED Provider Notes (Signed)
[facility] EMERGENCY DEPARTMENT Provider Note   CSN: [id number] Arrival date & time: 02/09/21  5400     History Chief Complaint  Patient presents with   Allergic Reaction    Vincent West is a 30 y.o. male.  Patient presents for 2 day history of tongue irritation. He states it feels like there is a cut on the middle of the tongue, but denies trauma. He has some sore ness in the neck but no swelling, voice change, trouble swallowing or breathing. No allergic exposures. No treatment PTA.       Past Medical History:  Diagnosis Date   Asthma     There are no problems to display for this patient.   History reviewed. No pertinent surgical history.     Family History  Problem Relation Age of Onset   Healthy Mother     Social History   Tobacco Use   Smoking status: Former    Packs/day: 0.00    Types: Cigarettes    Quit date: 04/01/2016    Years since quitting: 4.8   Smokeless tobacco: Never  Substance Use Topics   Alcohol use: Yes    Comment: occ    Drug use: Not Currently    Types: Marijuana    Comment: molly    Home Medications Prior to Admission medications   Medication Sig Start Date End Date Taking? Authorizing Provider  albuterol (VENTOLIN HFA) 108 (90 Base) MCG/ACT inhaler Inhale 1-2 puffs into the lungs every 6 (six) hours as needed for wheezing or shortness of breath. 07/29/20   Wallis Bamberg, PA-C  benzonatate (TESSALON) 100 MG capsule Take 1-2 capsules (100-200 mg total) by mouth 3 (three) times daily as needed for cough. 07/29/20   Wallis Bamberg, PA-C  cetirizine (ZYRTEC ALLERGY) 10 MG tablet Take 1 tablet (10 mg total) by mouth daily. 07/29/20   Wallis Bamberg, PA-C  lidocaine (XYLOCAINE) 2 % solution Use as directed 15 mLs in the mouth or throat every 3 (three) hours as needed for mouth pain. 02/09/21   Renne Crigler, PA-C  promethazine-dextromethorphan (PROMETHAZINE-DM) 6.25-15 MG/5ML syrup Take 5 mLs by mouth at bedtime as needed for cough.  07/29/20   Wallis Bamberg, PA-C  pseudoephedrine (SUDAFED) 60 MG tablet Take 1 tablet (60 mg total) by mouth every 8 (eight) hours as needed for congestion. 07/29/20   Wallis Bamberg, PA-C    Allergies    Patient has no known allergies.  Review of Systems   Review of Systems  Constitutional:  Negative for fever.  HENT:  Positive for mouth sores. Negative for dental problem, facial swelling, sore throat and trouble swallowing.   Respiratory:  Negative for shortness of breath and stridor.   Musculoskeletal:  Positive for neck pain (sore).  Skin:  Negative for color change.  Neurological:  Negative for headaches.  Hematological:  Negative for adenopathy.   Physical Exam Updated Vital Signs BP 137/89 (BP Location: Left Arm)   Pulse 80   Temp 99.6 F (37.6 C) (Oral)   Resp 16   SpO2 100%   Physical Exam Vitals and nursing note reviewed.  Constitutional:      Appearance: He is well-developed.  HENT:     Head: Normocephalic and atraumatic.     Jaw: No trismus.     Right Ear: Tympanic membrane, ear canal and external ear normal.     Left Ear: Tympanic membrane, ear canal and external ear normal.     Nose: Nose normal.  Mouth/Throat:     Dentition: Normal dentition. No dental caries or dental abscesses.     Pharynx: Uvula midline. No uvula swelling.     Tonsils: No tonsillar abscesses.   Eyes:     Pupils: Pupils are equal, round, and reactive to light.  Neck:     Comments: No neck swelling or Lugwig's angina. Reports generalized submandibular soreness. No significant lymphadenopathy.  Musculoskeletal:     Cervical back: Normal range of motion and neck supple.  Skin:    General: Skin is warm and dry.  Neurological:     Mental Status: He is alert.  Psychiatric:        Mood and Affect: Mood normal.    ED Results / Procedures / Treatments   Labs (all labs ordered are listed, but only abnormal results are displayed) Labs Reviewed - No data to display  EKG None  Radiology No  results found.  Procedures Procedures   Medications Ordered in ED Medications - No data to display  ED Course  I have reviewed the triage vital signs and the nursing notes.  Pertinent labs & imaging results that were available during my care of the patient were reviewed by me and considered in my medical decision making (see chart for details).  Patient seen and examined. Symptoms mild, no indication for imaging. Recc: salt water rinses, lidocaine/tylenol/NSAIDs for pain control. Would expect it to resolve over the next few days but if worsening, should return for recheck.    Vital signs reviewed and are as follows: BP 137/89 (BP Location: Left Arm)   Pulse 80   Temp 99.6 F (37.6 C) (Oral)   Resp 16   SpO2 100%      MDM Rules/Calculators/A&P                          Patient with mild tongue irritation -- ? Infection, trauma. No airway compromise. Does not appear consistent with angioedema. No severe lymphadenopathy. Conservative treatment indicated with return if worsening. Sx mild, no need for imaging now.     Final Clinical Impression(s) / ED Diagnoses Final diagnoses:  Tongue irritation    Rx / DC Orders ED Discharge Orders          Ordered    lidocaine (XYLOCAINE) 2 % solution  Every  3 hours PRN        02/09/21 0853             Renne Crigler, PA-C 02/09/21 0901    Horton, Clabe Seal, DO 02/09/21 [phone]

## 2021-02-09 NOTE — ED Notes (Signed)
Reviewed discharge instructions with patient. Follow-up care reviewed. Patient verbalized understanding. Patient A&Ox4, VSS, and ambulatory with steady gait upon discharge.  

## 2021-02-09 NOTE — Discharge Instructions (Signed)
Please read and follow all provided instructions.  Your diagnoses today include:  1. Tongue irritation    The exam and treatment you received today has been provided on an emergency basis only. This is not a substitute for complete medical or dental care.  Tests performed today include: Vital signs. See below for your results today.   Medications prescribed:  Lidocaine - numbing medication for mouth  Take any prescribed medications only as directed.  Home care instructions:  Follow any educational materials contained in this packet.  Return instructions:  Please return to the Emergency Department if you experience worsening symptoms. Please return if you develop a fever, you develop more swelling in your face or neck, you have trouble breathing or swallowing food. Please return if you have any other emergent concerns.  Additional Information:  Your vital signs today were: BP 137/89 (BP Location: Left Arm)   Pulse 80   Temp 99.6 F (37.6 C) (Oral)   Resp 16   SpO2 100%  If your blood pressure (BP) was elevated above 135/85 this visit, please have this repeated by your doctor within one month. --------------

## 2021-11-11 ENCOUNTER — Ambulatory Visit (HOSPITAL_COMMUNITY)
Admission: EM | Admit: 2021-11-11 | Discharge: 2021-11-11 | Disposition: A | Discharge status: Home / Self Care | Payer: Self-pay | Attending: Emergency Medicine | Admitting: Emergency Medicine

## 2021-11-11 ENCOUNTER — Encounter (HOSPITAL_COMMUNITY): Payer: Self-pay | Admitting: Emergency Medicine

## 2021-11-11 DIAGNOSIS — Z113 Encounter for screening for infections with a predominantly sexual mode of transmission: Secondary | ICD-10-CM | POA: Insufficient documentation

## 2021-11-11 DIAGNOSIS — Z202 Contact with and (suspected) exposure to infections with a predominantly sexual mode of transmission: Secondary | ICD-10-CM | POA: Insufficient documentation

## 2021-11-11 DIAGNOSIS — J069 Acute upper respiratory infection, unspecified: Secondary | ICD-10-CM | POA: Insufficient documentation

## 2021-11-11 LAB — POCT RAPID STREP A, ED / UC: Streptococcus, Group A Screen (Direct): NEGATIVE

## 2021-11-11 MED ORDER — DOXYCYCLINE HYCLATE 100 MG PO CAPS: 100.0000 mg | ORAL_CAPSULE | Freq: Two times a day (BID) | ORAL | 0 refills | Status: DC

## 2021-11-11 NOTE — ED Triage Notes (Addendum)
Pt is present today with concerns with exposure to an STD. Pt states that he was told today about being exposed to chlamydia. Pt denies any symptoms. ?

## 2021-11-11 NOTE — Addendum Note (Signed)
Encounter addended by: Valinda Hoar, NP on: 11/11/2021 4:42 PM ? Actions taken: Charge Capture section accepted

## 2021-11-11 NOTE — Discharge Instructions (Addendum)
Take doxycyline twice a day for 7 days to treat for chlamydia  ? ?Labs pending 2-3 days, you will be contacted if positive for any sti and treatment will be sent to the pharmacy, you will have to return to the clinic if positive for gonorrhea to receive treatment  ? ?Please refrain from having sex until labs results, if positive please refrain from having sex until treatment complete and symptoms resolve  ? ?If positive for  Chlamydia  gonorrhea or trichomoniasis please notify partner or partners so they may tested as well ? ?Moving forward, it is recommended you use some form of protection against the transmission of sti infections  such as condoms or dental dams with each sexual encounter   ? ?Your symptoms today are most likely being caused by a virus and should steadily improve in time it can take up to 7 to 10 days before you truly start to see a turnaround however things will get better ? ?Strep PCR ?   ?You can take Tylenol and/or Ibuprofen as needed for fever reduction and pain relief. ?  ?For cough: honey 1/2 to 1 teaspoon (you can dilute the honey in water or another fluid).  You can also use guaifenesin and dextromethorphan for cough. You can use a humidifier for chest congestion and cough.  If you don't have a humidifier, you can sit in the bathroom with the hot shower running.    ?  ?For sore throat: try warm salt water gargles, cepacol lozenges, throat spray, warm tea or water with lemon/honey, popsicles or ice, or OTC cold relief medicine for throat discomfort. ?  ?For congestion: take a daily anti-histamine like Zyrtec, Claritin, and a oral decongestant, such as pseudoephedrine.  You can also use Flonase 1-2 sprays in each nostril daily. ?  ?It is important to stay hydrated: drink plenty of fluids (water, gatorade/powerade/pedialyte, juices, or teas) to keep your throat moisturized and help further relieve irritation/discomfort.  ?

## 2021-11-11 NOTE — ED Provider Notes (Signed)
?Vincent West ? ? ? ?CSN: [id number] ?Arrival date & time: 11/11/21  1546 ? ? ?  ? ?History   ?Chief Complaint ?Chief Complaint  ?Patient presents with  ? Exposure to STD  ? ? ?HPI ?[name] is a 31 y.o. male.  ? ?Patient presents with sore throat, mild cough and congestion that began 1 day ago.  Tolerating food and liquids.  No known sick contacts.  Has attempted use of Vicks cold and flu which has been minimally effective.  History of asthma.  Denies shortness of breath, wheezing, ear pain, headaches, abdominal pain, nausea, vomiting, diarrhea. ? ?Patient requesting STD testing.  Known exposure to chlamydia.  Denies all symptoms. ? ? ? ?Past Medical History:  ?Diagnosis Date  ? Asthma   ? ? ?There are no problems to display for this patient. ? ? ?History reviewed. No pertinent surgical history. ? ? ? ? ?Home Medications   ? ?Prior to Admission medications   ?Medication Sig Start Date End Date Taking? Authorizing Provider  ?albuterol (VENTOLIN HFA) 108 (90 Base) MCG/ACT inhaler Inhale 1-2 puffs into the lungs every 6 (six) hours as needed for wheezing or shortness of breath. 07/29/20   Jaynee Eagles, PA-C  ?benzonatate (TESSALON) 100 MG capsule Take 1-2 capsules (100-200 mg total) by mouth 3 (three) times daily as needed for cough. 07/29/20   Jaynee Eagles, PA-C  ?cetirizine (ZYRTEC ALLERGY) 10 MG tablet Take 1 tablet (10 mg total) by mouth daily. 07/29/20   Jaynee Eagles, PA-C  ?lidocaine (XYLOCAINE) 2 % solution Use as directed 15 mLs in the mouth or throat every 3 (three) hours as needed for mouth pain. 02/09/21   Carlisle Cater, PA-C  ?promethazine-dextromethorphan (PROMETHAZINE-DM) 6.25-15 MG/5ML syrup Take 5 mLs by mouth at bedtime as needed for cough. 07/29/20   Jaynee Eagles, PA-C  ?pseudoephedrine (SUDAFED) 60 MG tablet Take 1 tablet (60 mg total) by mouth every 8 (eight) hours as needed for congestion. 07/29/20   Jaynee Eagles, PA-C  ? ? ?Family History ?Family History  ?Problem Relation Age of Onset  ? Healthy  Mother   ? ? ?Social History ?Social History  ? ?Tobacco Use  ? Smoking status: Former  ?  Packs/day: 0.00  ?  Types: Cigarettes  ?  Quit date: 04/01/2016  ?  Years since quitting: 5.6  ? Smokeless tobacco: Never  ?Substance Use Topics  ? Alcohol use: Yes  ?  Comment: occ   ? Drug use: Not Currently  ?  Types: Marijuana  ?  Comment: molly  ? ? ? ?Allergies   ?Patient has no known allergies. ? ? ?Review of Systems ?Review of Systems ?Defer to HPI  ? ? ?Physical Exam ?Triage Vital Signs ?ED Triage Vitals  ?Enc Vitals Group  ?   BP 11/11/21 1557 (!) 153/91  ?   Pulse Rate 11/11/21 1557 80  ?   Resp 11/11/21 1557 17  ?   Temp 11/11/21 1557 (!) 97.4 ?F (36.3 ?C)  ?   Temp Source 11/11/21 1557 Oral  ?   SpO2 11/11/21 1557 97 %  ?   Weight --   ?   Height --   ?   Head Circumference --   ?   Peak Flow --   ?   Pain Score 11/11/21 1556 0  ?   Pain Loc --   ?   Pain Edu? --   ?   Excl. in [place]? --   ? ?No data found. ? ?Updated  Vital Signs ?BP (!) 153/91   Pulse 80   Temp (!) 97.4 ?F (36.3 ?C) (Oral)   Resp 17   SpO2 97%  ? ?Visual Acuity ?Right Eye Distance:   ?Left Eye Distance:   ?Bilateral Distance:   ? ?Right Eye Near:   ?Left Eye Near:    ?Bilateral Near:    ? ?Physical Exam ?Constitutional:   ?   Appearance: Normal appearance.  ?HENT:  ?   Head: Normocephalic.  ?   Right Ear: Tympanic membrane, ear canal and external ear normal.  ?   Left Ear: Tympanic membrane, ear canal and external ear normal.  ?   Nose: Nose normal.  ?   Mouth/Throat:  ?   Mouth: Mucous membranes are moist.  ?   Pharynx: Posterior oropharyngeal erythema present.  ?   Tonsils: No tonsillar exudate. 0 on the right. 0 on the left.  ?Eyes:  ?   Extraocular Movements: Extraocular movements intact.  ?Pulmonary:  ?   Effort: Pulmonary effort is normal.  ?Genitourinary: ?   Comments: deferred ?Skin: ?   General: Skin is warm and dry.  ?Neurological:  ?   Mental Status: He is alert and oriented to person, place, and time. Mental status is at baseline.   ?Psychiatric:     ?   Mood and Affect: Mood normal.     ?   Behavior: Behavior normal.  ? ? ? ?UC Treatments / Results  ?Labs ?(all labs ordered are listed, but only abnormal results are displayed) ?Labs Reviewed  ?CYTOLOGY, (ORAL, ANAL, URETHRAL) ANCILLARY ONLY  ? ? ?EKG ? ? ?Radiology ?No results found. ? ?Procedures ?Procedures (including critical care time) ? ?Medications Ordered in UC ?Medications - No data to display ? ?Initial Impression / Assessment and Plan / UC Course  ?I have reviewed the triage vital signs and the nursing notes. ? ?Pertinent labs & imaging results that were available during my care of the patient were reviewed by me and considered in my medical decision making (see chart for details). ? ?Exposure to STD ?Routine screening for STI ?Viral URI ? ?Will prophylactically cover for chlamydia, doxycycline 7-day course prescribed, discussed administration, STI swab pending, will treat per protocol, advised abstinence until all labs are resulted and all treatment is complete, may follow-up with urgent care as needed, advised condom use during all sexual encounters moving forward ? ?Vital signs are stable, O2 saturation 97%, lungs clear to auscultation, stable for outpatient treatment, strep PCR negative, etiology is symptoms are most likely viral versus seasonal allergies, discussed with patient, may use over-the-counter medications for supportive care, may follow-up with urgent care as needed, work note given ?Final Clinical Impressions(s) / UC Diagnoses  ? ?Final diagnoses:  ?Exposure to STD  ?Routine screening for STI (sexually transmitted infection)  ? ? ? ?Discharge Instructions   ? ?  ?Take doxycyline twice a day for 7 days to treat for chlamydia  ? ?Labs pending 2-3 days, you will be contacted if positive for any sti and treatment will be sent to the pharmacy, you will have to return to the clinic if positive for gonorrhea to receive treatment  ? ?Please refrain from having sex until labs  results, if positive please refrain from having sex until treatment complete and symptoms resolve  ? ?If positive for  Chlamydia  gonorrhea or trichomoniasis please notify partner or partners so they may tested as well ? ?Moving forward, it is recommended you use some form of protection against  the transmission of sti infections  such as condoms or dental dams with each sexual encounter   ? ? ?ED Prescriptions   ?None ?  ? ?PDMP not reviewed this encounter. ?  ?Hans Eden, NP ?11/11/21 1642 ? ?

## 2021-11-12 LAB — CYTOLOGY, (ORAL, ANAL, URETHRAL) ANCILLARY ONLY
Chlamydia: POSITIVE — AB
Comment: NEGATIVE
Comment: NEGATIVE
Comment: NORMAL
Neisseria Gonorrhea: NEGATIVE
Trichomonas: NEGATIVE

## 2021-11-14 LAB — CULTURE, GROUP A STREP (THRC)

## 2022-07-27 ENCOUNTER — Encounter (HOSPITAL_COMMUNITY): Payer: Self-pay | Admitting: *Deleted

## 2022-07-27 ENCOUNTER — Other Ambulatory Visit: Payer: Self-pay

## 2022-07-27 ENCOUNTER — Ambulatory Visit (HOSPITAL_COMMUNITY)
Admission: EM | Admit: 2022-07-27 | Discharge: 2022-07-27 | Disposition: A | Discharge status: Home / Self Care | Payer: Self-pay | Attending: Emergency Medicine | Admitting: Emergency Medicine

## 2022-07-27 DIAGNOSIS — R369 Urethral discharge, unspecified: Secondary | ICD-10-CM | POA: Insufficient documentation

## 2022-07-27 DIAGNOSIS — Z202 Contact with and (suspected) exposure to infections with a predominantly sexual mode of transmission: Secondary | ICD-10-CM | POA: Insufficient documentation

## 2022-07-27 DIAGNOSIS — N342 Other urethritis: Secondary | ICD-10-CM | POA: Insufficient documentation

## 2022-07-27 MED ORDER — LIDOCAINE HCL (PF) 1 % IJ SOLN
INTRAMUSCULAR | Status: AC
  Filled 2022-07-27: qty 2

## 2022-07-27 MED ORDER — CEFTRIAXONE SODIUM 500 MG IJ SOLR
500.0000 mg | Freq: Once | INTRAMUSCULAR | Status: AC
  Administered 2022-07-27: 500 mg via INTRAMUSCULAR

## 2022-07-27 MED ORDER — DOXYCYCLINE HYCLATE 100 MG PO CAPS: 100.0000 mg | ORAL_CAPSULE | Freq: Two times a day (BID) | ORAL | 0 refills | Status: DC

## 2022-07-27 MED ORDER — CEFTRIAXONE SODIUM 500 MG IJ SOLR
INTRAMUSCULAR | Status: AC
  Filled 2022-07-27: qty 500

## 2022-07-27 NOTE — Discharge Instructions (Signed)
Doxycycline is being sent to the pharmacy, you will take this medication 2 times daily for the next 7 days.  Please make sure to take this medication with a full glass of water.    We will call you if your test results warrant a change in the plan of care, you may view these test results on MyChart.  Please refrain from any sexual activity until receiving all your test results and finishing the antibiotic.

## 2022-07-27 NOTE — ED Provider Notes (Signed)
[facility]    CSN: [id number] Arrival date & time: 07/27/22  1801      History   Chief Complaint Chief Complaint  Patient presents with   Exposure to STD    HPI Vincent West is a 32 y.o. male.  Patient reports penile discharge and dysuria that started this past week.  He reports that he has had a known exposure to gonorrhea from a male partner.  He denies any testicular pain, abdominal pain, nausea, vomiting, flank pain, hematuria, changes to the skin on his private area, or any systemic symptoms. Patient has not taken any medicines for his symptoms.    Exposure to STD    Past Medical History:  Diagnosis Date   Asthma     There are no problems to display for this patient.   History reviewed. No pertinent surgical history.     Home Medications    Prior to Admission medications   Medication Sig Start Date End Date Taking? Authorizing Provider  doxycycline (VIBRAMYCIN) 100 MG capsule Take 1 capsule (100 mg total) by mouth 2 (two) times daily. 07/27/22  Yes Flossie Dibble, NP  albuterol (VENTOLIN HFA) 108 (90 Base) MCG/ACT inhaler Inhale 1-2 puffs into the lungs every 6 (six) hours as needed for wheezing or shortness of breath. 07/29/20   Jaynee Eagles, PA-C    Family History Family History  Problem Relation Age of Onset   Healthy Mother     Social History Social History   Tobacco Use   Smoking status: Former    Packs/day: 0.00    Types: Cigarettes    Quit date: 04/01/2016    Years since quitting: 6.3   Smokeless tobacco: Never  Substance Use Topics   Alcohol use: Yes    Comment: occ    Drug use: Not Currently    Types: Marijuana    Comment: molly     Allergies   Patient has no known allergies.   Review of Systems Review of Systems Per HPI  Physical Exam Triage Vital Signs ED Triage Vitals  Enc Vitals Group     BP 07/27/22 1958 130/88     Pulse Rate 07/27/22 1958 65     Resp 07/27/22 1958 18     Temp 07/27/22 1958 98.1 F  (36.7 C)     Temp src --      SpO2 07/27/22 1958 98 %     Weight --      Height --      Head Circumference --      Peak Flow --      Pain Score 07/27/22 1957 7     Pain Loc --      Pain Edu? --      Excl. in [place]? --    No data found.  Updated Vital Signs BP 130/88   Pulse 65   Temp 98.1 F (36.7 C)   Resp 18   SpO2 98%     Physical Exam Vitals and nursing note reviewed.  Constitutional:      Appearance: Normal appearance.  Genitourinary:    Comments: Deferred exam.  Neurological:     Mental Status: He is alert.      UC Treatments / Results  Labs (all labs ordered are listed, but only abnormal results are displayed) Labs Reviewed  CYTOLOGY, (ORAL, ANAL, URETHRAL) ANCILLARY ONLY    EKG   Radiology No results found.  Procedures Procedures (including critical care time)  Medications Ordered in UC Medications  cefTRIAXone (ROCEPHIN) injection 500 mg (500 mg Intramuscular Given 07/27/22 2022)    Initial Impression / Assessment and Plan / UC Course  I have reviewed the triage vital signs and the nursing notes.  Pertinent labs & imaging results that were available during my care of the patient were reviewed by me and considered in my medical decision making (see chart for details).   Patient was evaluated for STD exposure, penile discharge, and urethritis.  Patient was empirically treated for gonorrhea and chlamydia.  Rocephin injection was given in office.  Doxycycline was sent to the pharmacy.  Patient declined HIV and RPR testing.Patient made aware of timeline for symptom resolution and when follow-up would be necessary.  Patient made aware of results reporting protocol and MyChart.  Patient verbalized understanding of instructions.    Charting was provided using a a verbal dictation system, charting was proofread for errors, errors may occur which could change the meaning of the information charted.   Final Clinical Impressions(s) / UC Diagnoses   Final  diagnoses:  STD exposure  Penile discharge  Urethritis     Discharge Instructions      Doxycycline is being sent to the pharmacy, you will take this medication 2 times daily for the next 7 days.  Please make sure to take this medication with a full glass of water.    We will call you if your test results warrant a change in the plan of care, you may view these test results on MyChart.  Please refrain from any sexual activity until receiving all your test results and finishing the antibiotic.     ED Prescriptions     Medication Sig Dispense Auth. Provider   doxycycline (VIBRAMYCIN) 100 MG capsule Take 1 capsule (100 mg total) by mouth 2 (two) times daily. 14 capsule Flossie Dibble, NP      PDMP not reviewed this encounter.   Flossie Dibble, NP 07/28/22 1221

## 2022-07-27 NOTE — ED Triage Notes (Signed)
Pt reports his partner reported being positive for STD . Pt has discharge from penis and irritation at urethra

## 2022-07-28 LAB — CYTOLOGY, (ORAL, ANAL, URETHRAL) ANCILLARY ONLY
Chlamydia: NEGATIVE
Comment: NEGATIVE
Comment: NEGATIVE
Comment: NORMAL
Neisseria Gonorrhea: NEGATIVE
Trichomonas: NEGATIVE

## 2022-08-04 ENCOUNTER — Encounter (HOSPITAL_COMMUNITY): Payer: Self-pay

## 2022-08-04 ENCOUNTER — Emergency Department (HOSPITAL_COMMUNITY): Payer: Self-pay

## 2022-08-04 ENCOUNTER — Ambulatory Visit (HOSPITAL_COMMUNITY): Admission: EM | Admit: 2022-08-04 | Discharge: 2022-08-04 | Payer: Self-pay

## 2022-08-04 ENCOUNTER — Emergency Department (HOSPITAL_COMMUNITY)
Admission: EM | Admit: 2022-08-04 | Discharge: 2022-08-05 | Disposition: A | Discharge status: Home / Self Care | Payer: Self-pay | Attending: Emergency Medicine | Admitting: Emergency Medicine

## 2022-08-04 DIAGNOSIS — R209 Unspecified disturbances of skin sensation: Secondary | ICD-10-CM | POA: Insufficient documentation

## 2022-08-04 DIAGNOSIS — R197 Diarrhea, unspecified: Secondary | ICD-10-CM

## 2022-08-04 DIAGNOSIS — R1084 Generalized abdominal pain: Secondary | ICD-10-CM

## 2022-08-04 DIAGNOSIS — R55 Syncope and collapse: Secondary | ICD-10-CM | POA: Insufficient documentation

## 2022-08-04 DIAGNOSIS — K5792 Diverticulitis of intestine, part unspecified, without perforation or abscess without bleeding: Secondary | ICD-10-CM | POA: Insufficient documentation

## 2022-08-04 DIAGNOSIS — Z1152 Encounter for screening for COVID-19: Secondary | ICD-10-CM | POA: Insufficient documentation

## 2022-08-04 DIAGNOSIS — R112 Nausea with vomiting, unspecified: Secondary | ICD-10-CM

## 2022-08-04 DIAGNOSIS — R42 Dizziness and giddiness: Secondary | ICD-10-CM | POA: Insufficient documentation

## 2022-08-04 LAB — COMPREHENSIVE METABOLIC PANEL
ALT: 28 U/L (ref 0–44)
AST: 38 U/L (ref 15–41)
Albumin: 4.4 g/dL (ref 3.5–5.0)
Alkaline Phosphatase: 66 U/L (ref 38–126)
Anion gap: 14 (ref 5–15)
BUN: 10 mg/dL (ref 6–20)
CO2: 22 mmol/L (ref 22–32)
Calcium: 9.3 mg/dL (ref 8.9–10.3)
Chloride: 102 mmol/L (ref 98–111)
Creatinine, Ser: 1.11 mg/dL (ref 0.61–1.24)
GFR, Estimated: >60 mL/min (ref >60)
Glucose, Bld: 86 mg/dL (ref 70–99)
Potassium: 4 mmol/L (ref 3.5–5.1)
Sodium: 138 mmol/L (ref 135–145)
Total Bilirubin: 0.9 mg/dL (ref 0.3–1.2)
Total Protein: 7.8 g/dL (ref 6.5–8.1)

## 2022-08-04 LAB — CBC
HCT: 48.7 % (ref 39.0–52.0)
Hemoglobin: 17.1 g/dL — ABNORMAL HIGH (ref 13.0–17.0)
MCH: 29.9 pg (ref 26.0–34.0)
MCHC: 35.1 g/dL (ref 30.0–36.0)
MCV: 85.1 fL (ref 80.0–100.0)
Platelets: 281 10*3/uL (ref 150–400)
RBC: 5.72 MIL/uL (ref 4.22–5.81)
RDW: 13.3 % (ref 11.5–15.5)
WBC: 12.6 10*3/uL — ABNORMAL HIGH (ref 4.0–10.5)
nRBC: 0 % (ref 0.0–0.2)

## 2022-08-04 LAB — URINALYSIS, ROUTINE W REFLEX MICROSCOPIC
Bilirubin Urine: NEGATIVE
Glucose, UA: NEGATIVE mg/dL
Hgb urine dipstick: NEGATIVE
Ketones, ur: 80 mg/dL — AB
Leukocytes,Ua: NEGATIVE
Nitrite: NEGATIVE
Protein, ur: 30 mg/dL — AB
Specific Gravity, Urine: 1.025 (ref 1.005–1.030)
pH: 8 (ref 5.0–8.0)

## 2022-08-04 LAB — RESP PANEL BY RT-PCR (RSV, FLU A&B, COVID)  RVPGX2
Influenza A by PCR: NEGATIVE
Influenza B by PCR: NEGATIVE
Resp Syncytial Virus by PCR: NEGATIVE
SARS Coronavirus 2 by RT PCR: NEGATIVE

## 2022-08-04 LAB — LIPASE, BLOOD: Lipase: 31 U/L (ref 11–51)

## 2022-08-04 MED ORDER — IOHEXOL 350 MG/ML SOLN
75.0000 mL | Freq: Once | INTRAVENOUS | Status: AC | PRN
  Administered 2022-08-04: 75 mL via INTRAVENOUS

## 2022-08-04 NOTE — ED Notes (Signed)
Patient is being discharged from the Urgent Care and sent to the Emergency Department via personal vehicle. Per Rosario Adie NP, patient is in need of higher level of care due to increased abdominal pain. Patient is aware and verbalizes understanding of plan of care.  Vitals:   08/04/22 1749  BP: (!) 135/59  Pulse: (!) 119  Resp: 20  Temp: 99.5 F (37.5 C)  SpO2: 98%

## 2022-08-04 NOTE — ED Provider Triage Note (Signed)
Emergency Medicine Provider Triage Evaluation Note  Vincent West , a 32 y.o. male  was evaluated in triage.  Pt complains of abdominal pain associate with nausea, vomiting, diarrhea that started this morning.  Seen at urgent care.  Review of Systems  Positive: Abdominal pain Negative: fever  Physical Exam  BP (!) 140/94   Pulse (!) 121   Temp 98.9 F (37.2 C) (Oral)   Resp (!) 24   SpO2 99%  Gen:   Awake, no distress   Resp:  Normal effort  MSK:   Moves extremities without difficulty  Other:  Diffuse tenderness  Medical Decision Making  Medically screening exam initiated at 6:44 PM.  Appropriate orders placed.  Haskel Dewalt was informed that the remainder of the evaluation will be completed by another provider, this initial triage assessment does not replace that evaluation, and the importance of remaining in the ED until their evaluation is complete.  Labs CT abdomen   Karie Kirks 08/04/22 7672

## 2022-08-04 NOTE — ED Triage Notes (Signed)
Patient here for evaluation of LUQ abdominal pain, emesis, and diarrhea that after waking this morning. Patient is alert, oriented, and in no apparent distress at this time.

## 2022-08-04 NOTE — Discharge Instructions (Signed)
Pt to go to ER for further evaluation and treatment

## 2022-08-04 NOTE — ED Provider Notes (Addendum)
[facility]    CSN: [id number] Arrival date & time: 08/04/22  1738      History   Chief Complaint Chief Complaint  Patient presents with   Emesis   Diarrhea    HPI Vincent West is a 32 y.o. male Patient is a 32 y.o. male who presents for evaluation of nausea, vomiting, diarrhea and abdominal pain for 1 days. Patient reports 6-10 episodes of vomiting. Patient reports 6-10 episodes of diarrhea. Vomit and stool are non bloody.  He endorses abdominal cramping with chills, dizziness, near syncope, and numbness/tingling in his hands and lips (lips sensation resolved by time he was roomed).  States has been unable to keep any fluids down today.  Denies any URI symptoms.  No GI history including Crohn's, IBS.  Abdominal surgeries.  He has not taken any OTC medications for symptoms.  Patient states he feels horrible and thinks he needs to go to the ER.       Past Medical History:  Diagnosis Date   Asthma     There are no problems to display for this patient.   History reviewed. No pertinent surgical history.     Home Medications    Prior to Admission medications   Medication Sig Start Date End Date Taking? Authorizing Provider  albuterol (VENTOLIN HFA) 108 (90 Base) MCG/ACT inhaler Inhale 1-2 puffs into the lungs every 6 (six) hours as needed for wheezing or shortness of breath. 07/29/20   Jaynee Eagles, PA-C  doxycycline (VIBRAMYCIN) 100 MG capsule Take 1 capsule (100 mg total) by mouth 2 (two) times daily. 07/27/22   Flossie Dibble, NP    Family History Family History  Problem Relation Age of Onset   Healthy Mother     Social History Social History   Tobacco Use   Smoking status: Former    Packs/day: 0.00    Types: Cigarettes    Quit date: 04/01/2016    Years since quitting: 6.3   Smokeless tobacco: Never  Substance Use Topics   Alcohol use: Yes    Comment: occ    Drug use: Not Currently    Types: Marijuana    Comment: molly     Allergies    Patient has no known allergies.   Review of Systems Review of Systems  Constitutional:  Positive for chills.  Gastrointestinal:  Positive for abdominal pain, diarrhea and vomiting.  Neurological:  Positive for dizziness.     Physical Exam Triage Vital Signs ED Triage Vitals  Enc Vitals Group     BP 08/04/22 1749 (!) 135/59     Pulse Rate 08/04/22 1749 (!) 119     Resp 08/04/22 1749 20     Temp 08/04/22 1749 99.5 F (37.5 C)     Temp Source 08/04/22 1749 Oral     SpO2 08/04/22 1749 98 %     Weight 08/04/22 1748 170 lb (77.1 kg)     Height 08/04/22 1748 5\' 5"  (1.651 m)     Head Circumference --      Peak Flow --      Pain Score 08/04/22 1748 10     Pain Loc --      Pain Edu? --      Excl. in [place]? --    No data found.  Updated Vital Signs BP (!) 135/59 (BP Location: Left Arm)   Pulse (!) 119   Temp 99.5 F (37.5 C) (Oral)   Resp 20   Ht 5\' 5"  (1.651 m)  Wt 170 lb (77.1 kg)   SpO2 98%   BMI 28.29 kg/m   Visual Acuity Right Eye Distance:   Left Eye Distance:   Bilateral Distance:    Right Eye Near:   Left Eye Near:    Bilateral Near:     Physical Exam Vitals and nursing note reviewed.  Constitutional:      Appearance: Normal appearance. He is ill-appearing. He is not toxic-appearing or diaphoretic.     Comments: Pt sitting in wheelchair rocking   HENT:     Head: Normocephalic and atraumatic.  Eyes:     Pupils: Pupils are equal, round, and reactive to light.  Cardiovascular:     Rate and Rhythm: Tachycardia present.     Heart sounds: Normal heart sounds.  Pulmonary:     Effort: Pulmonary effort is normal.     Breath sounds: Normal breath sounds.  Abdominal:     General: Abdomen is flat.     Palpations: There is no hepatomegaly or splenomegaly.     Tenderness: There is generalized abdominal tenderness. There is guarding. There is no right CVA tenderness, left CVA tenderness or rebound. Negative signs include McBurney's sign and psoas sign.      Comments: Generalized abd pain worse on LLQ, LUQ, and periumbilical  Skin:    General: Skin is warm and dry.  Neurological:     General: No focal deficit present.     Mental Status: He is alert and oriented to person, place, and time.  Psychiatric:        Mood and Affect: Mood normal.        Behavior: Behavior normal.      UC Treatments / Results  Labs (all labs ordered are listed, but only abnormal results are displayed) Labs Reviewed - No data to display  EKG   Radiology No results found.  Procedures Procedures (including critical care time)  Medications Ordered in UC Medications - No data to display  Initial Impression / Assessment and Plan / UC Course  I have reviewed the triage vital signs and the nursing notes.  Pertinent labs & imaging results that were available during my care of the patient were reviewed by me and considered in my medical decision making (see chart for details).     I reviewed exam and sx with patient. Discussed likely viral enteritis/influenza with mild dehydration. Pt unable to walk due to abd pain/dizziness and is rocking in wheelchair. Pt feels that he needs to go to the ER. Pt will go POV to ER with his mother driving him.  Final Clinical Impressions(s) / UC Diagnoses   Final diagnoses:  Generalized abdominal pain  Nausea vomiting and diarrhea     Discharge Instructions      Pt to go to ER for further evaluation and treatment      ED Prescriptions   None    PDMP not reviewed this encounter.   Melynda Ripple, NP 08/04/22 1805    Melynda Ripple, NP 08/04/22 [phone]

## 2022-08-04 NOTE — ED Triage Notes (Signed)
Chief Complaint: emesis, diarrhea, SOB, weakness. Patient states feeling faint. Having intense abdominal pain and cramping. Patient light headed every time he stands. Hands and mouth numb for the last 30 mins   Onset: this morning   Prescriptions or OTC medications tried: No    Sick exposure: No  New foods, medications, or products: Yes- home made taco salad   Recent Travel: No

## 2022-08-05 MED ORDER — ACETAMINOPHEN 500 MG PO TABS
1000.0000 mg | ORAL_TABLET | Freq: Once | ORAL | Status: AC
  Administered 2022-08-05: 1000 mg via ORAL
  Filled 2022-08-05: qty 2

## 2022-08-05 MED ORDER — ONDANSETRON 4 MG PO TBDP: 4.0000 mg | ORAL_TABLET | ORAL | 0 refills | Status: DC | PRN

## 2022-08-05 MED ORDER — ONDANSETRON 4 MG PO TBDP
4.0000 mg | ORAL_TABLET | Freq: Once | ORAL | Status: AC
  Administered 2022-08-05: 4 mg via ORAL
  Filled 2022-08-05: qty 1

## 2022-08-05 MED ORDER — ACETAMINOPHEN 325 MG PO TABS
650.0000 mg | ORAL_TABLET | Freq: Once | ORAL | Status: AC
  Administered 2022-08-05: 650 mg via ORAL
  Filled 2022-08-05: qty 2

## 2022-08-05 MED ORDER — CIPROFLOXACIN HCL 500 MG PO TABS: 500.0000 mg | ORAL_TABLET | Freq: Two times a day (BID) | ORAL | 0 refills | Status: DC

## 2022-08-05 MED ORDER — METRONIDAZOLE 500 MG PO TABS: 500.0000 mg | ORAL_TABLET | Freq: Two times a day (BID) | ORAL | 0 refills | Status: DC

## 2022-08-05 NOTE — Discharge Instructions (Addendum)
1.  Start antibiotics as prescribed. 2.  Take Zofran every 4 hours if needed for nausea.  Take extra strength Tylenol every 6 hours as needed for pain. 3.  You should follow-up with a family doctor for recheck.  If you do not have a family doctor, use the referral number in your discharge instructions to find 1. 4.  Return to the emergency department if you get suddenly worsening pain, vomiting and cannot take your medications, fevers or other worsening and concerning symptoms.  Try to stay well-hydrated.  You do not need to eat much.  Only eat very small portions of low-fat bland foods for the next several days.

## 2022-08-05 NOTE — ED Provider Notes (Signed)
[facility] EMERGENCY DEPARTMENT Provider Note   CSN: [id number] Arrival date & time: 08/04/22  1808     History  Chief Complaint  Patient presents with   Abdominal Pain    Vincent West is a 32 y.o. male.  HPI HPI from urgent care.  I have reviewed and agree with: Vincent West is a 32 y.o. male Patient is a 32 y.o. male who presents for evaluation of nausea, vomiting, diarrhea and abdominal pain for 1 days. Patient reports 6-10 episodes of vomiting. Patient reports 6-10 episodes of diarrhea. Vomit and stool are non bloody.  He endorses abdominal cramping with chills, dizziness, near syncope, and numbness/tingling in his hands and lips (lips sensation resolved by time he was roomed).  States has been unable to keep any fluids down today.  Denies any URI symptoms.  No GI history including Crohn's, IBS.  Abdominal surgeries.  He has not taken any OTC medications for symptoms     Home Medications Prior to Admission medications   Medication Sig Start Date End Date Taking? Authorizing Provider  ciprofloxacin (CIPRO) 500 MG tablet Take 1 tablet (500 mg total) by mouth 2 (two) times daily. One po bid x 7 days 08/05/22  Yes Von Inscoe, Jeannie Done, MD  metroNIDAZOLE (FLAGYL) 500 MG tablet Take 1 tablet (500 mg total) by mouth 2 (two) times daily. One po bid x 7 days 08/05/22  Yes Arabia Nylund, Jeannie Done, MD  ondansetron (ZOFRAN-ODT) 4 MG disintegrating tablet Take 1 tablet (4 mg total) by mouth every 4 (four) hours as needed for nausea or vomiting. 08/05/22  Yes Charlesetta Shanks, MD  albuterol (VENTOLIN HFA) 108 (90 Base) MCG/ACT inhaler Inhale 1-2 puffs into the lungs every 6 (six) hours as needed for wheezing or shortness of breath. 07/29/20   Jaynee Eagles, PA-C  doxycycline (VIBRAMYCIN) 100 MG capsule Take 1 capsule (100 mg total) by mouth 2 (two) times daily. 07/27/22   Flossie Dibble, NP      Allergies    Patient has no known allergies.    Review of Systems   Review of  Systems  Physical Exam Updated Vital Signs BP 135/80 (BP Location: Right Arm)   Pulse 95   Temp 98.2 F (36.8 C)   Resp 16   SpO2 97%  Physical Exam Constitutional:      Appearance: Normal appearance.     Comments: Patient is clinical well appearance.  Nontoxic.  Well-nourished well-developed.  HENT:     Head: Normocephalic and atraumatic.     Mouth/Throat:     Pharynx: Oropharynx is clear.  Eyes:     Extraocular Movements: Extraocular movements intact.  Cardiovascular:     Rate and Rhythm: Normal rate and regular rhythm.  Pulmonary:     Effort: Pulmonary effort is normal.     Breath sounds: Normal breath sounds.  Abdominal:     Palpations: Abdomen is soft.     Comments: Mild to moderate left lower lateral quadrant tenderness to palpation.  No guarding.  No CVA tenderness.  Musculoskeletal:        General: No swelling or tenderness. Normal range of motion.     Right lower leg: No edema.     Left lower leg: No edema.  Skin:    General: Skin is warm.  Neurological:     General: No focal deficit present.     Mental Status: He is alert and oriented to person, place, and time.     Coordination: Coordination normal.  Psychiatric:  Mood and Affect: Mood normal.     ED Results / Procedures / Treatments   Labs (all labs ordered are listed, but only abnormal results are displayed) Labs Reviewed  CBC - Abnormal; Notable for the following components:      Result Value   WBC 12.6 (*)    Hemoglobin 17.1 (*)    All other components within normal limits  URINALYSIS, ROUTINE W REFLEX MICROSCOPIC - Abnormal; Notable for the following components:   Ketones, ur 80 (*)    Protein, ur 30 (*)    Bacteria, UA FEW (*)    All other components within normal limits  RESP PANEL BY RT-PCR (RSV, FLU A&B, COVID)  RVPGX2  LIPASE, BLOOD  COMPREHENSIVE METABOLIC PANEL    EKG None  Radiology CT ABDOMEN PELVIS W CONTRAST  Result Date: 08/04/2022 CLINICAL DATA:  Left lower  quadrant abdominal pain EXAM: CT ABDOMEN AND PELVIS WITH CONTRAST TECHNIQUE: Multidetector CT imaging of the abdomen and pelvis was performed using the standard protocol following bolus administration of intravenous contrast. RADIATION DOSE REDUCTION: This exam was performed according to the departmental dose-optimization program which includes automated exposure control, adjustment of the mA and/or kV according to patient size and/or use of iterative reconstruction technique. CONTRAST:  10mL OMNIPAQUE IOHEXOL 350 MG/ML SOLN COMPARISON:  None Available. FINDINGS: Lower chest: No acute abnormality. Hepatobiliary: Respiratory motion obscures detail in the upper abdomen. No suspicious hepatic lesion. Unremarkable gallbladder. No biliary dilation. Pancreas: Unremarkable. No pancreatic ductal dilatation or surrounding inflammatory changes. Spleen: Normal in size without focal abnormality. Adrenals/Urinary Tract: Normal adrenal glands. No urinary calculi or hydronephrosis. Bladder is nondistended Stomach/Bowel: Normal caliber large and small bowel. Normal appendix there are a few sigmoid diverticula. Question mild hyperemia and wall thickening about a sigmoid diverticulum (circa series 3/image 72). Vascular/Lymphatic: No significant vascular findings are present. No enlarged abdominal or pelvic lymph nodes. Reproductive: Prostate is unremarkable. Other: No abdominal wall hernia or abnormality. No abdominopelvic ascites. Musculoskeletal: No acute or significant osseous findings. IMPRESSION: Question mild sigmoid diverticulitis. Otherwise no acute abnormality in the abdomen pelvis. Electronically Signed   By: Minerva Fester M.D.   On: 08/04/2022 21:27    Procedures Procedures    Medications Ordered in ED Medications  ondansetron (ZOFRAN-ODT) disintegrating tablet 4 mg (has no administration in time range)  acetaminophen (TYLENOL) tablet 1,000 mg (has no administration in time range)  iohexol (OMNIPAQUE) 350 MG/ML  injection 75 mL (75 mLs Intravenous Contrast Given 08/04/22 2111)  acetaminophen (TYLENOL) tablet 650 mg (650 mg Oral Given 08/05/22 0236)    ED Course/ Medical Decision Making/ A&P                           Medical Decision Making Amount and/or Complexity of Data Reviewed Labs: ordered.   Patient had vomiting and diarrhea as described in urgent care note.  Patient denies any respiratory symptoms.  He is clinically well in appearance.  He has had a very long wait time in the waiting room.  Patient reports that he has not vomited since the middle of the night.  He has been drinking water.  Labs have already returned.  Patient has mild leukocytosis at 12.6.  BUN/creatinine are normal with normal renal function and normal electrolytes.  Urinalysis does not show signs of infection.  He does have some ketones present.  CT scan reviewed by radiology appearance of mild potential diverticular inflammation.  Otherwise negative.  Patient's pain correlates with  diverticular findings.  At this time given CT findings plus consistent pain pattern, will opt to treat for diverticulitis with Flagyl and Cipro.  Patient is clinically well in appearance.  He does not show clinical signs of dehydration.  At this time on examination heart rate is not elevated.  He has been tolerating fluids.  He is otherwise healthy.  At this time we will plan for initiating outpatient treatment for diverticulitis.  I have reviewed taking Zofran and staying hydrated with antibiotic use.  I have reviewed the nature of diverticulitis from mild to severe and educated on return precautions.  Patient is given a dose of Exer strength Tylenol and Zofran prior to discharge.  He is discharged in good condition well in appearance.        Final Clinical Impression(s) / ED Diagnoses Final diagnoses:  Diverticulitis    Rx / DC Orders ED Discharge Orders          Ordered    ciprofloxacin (CIPRO) 500 MG tablet  2 times daily         08/05/22 0731    metroNIDAZOLE (FLAGYL) 500 MG tablet  2 times daily        08/05/22 0731    ondansetron (ZOFRAN-ODT) 4 MG disintegrating tablet  Every 4 hours PRN        08/05/22 0731              Charlesetta Shanks, MD 08/05/22 [phone]

## 2023-02-08 ENCOUNTER — Ambulatory Visit (HOSPITAL_COMMUNITY)
Admission: EM | Admit: 2023-02-08 | Discharge: 2023-02-08 | Disposition: A | Discharge status: Home / Self Care | Payer: Self-pay | Attending: Internal Medicine | Admitting: Internal Medicine

## 2023-02-08 ENCOUNTER — Encounter (HOSPITAL_COMMUNITY): Payer: Self-pay | Admitting: Emergency Medicine

## 2023-02-08 DIAGNOSIS — R369 Urethral discharge, unspecified: Secondary | ICD-10-CM | POA: Insufficient documentation

## 2023-02-08 DIAGNOSIS — Z9189 Other specified personal risk factors, not elsewhere classified: Secondary | ICD-10-CM | POA: Insufficient documentation

## 2023-02-08 MED ORDER — DOXYCYCLINE HYCLATE 100 MG PO CAPS: 100.0000 mg | ORAL_CAPSULE | Freq: Two times a day (BID) | ORAL | 0 refills | Status: DC
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
## 2023-02-08 NOTE — Discharge Instructions (Signed)
You were tested today for STDs and the results are still pending; you have been given treatment for possible infections presumptively anyway due to your symptoms. You will receive a phone call in approximately 3 days if the results are positive. You should follow up with your primary care provider for further STI testing.  Avoid sexual intercourse for 7 days. Advise your sexual partner(s) to be evaluated, tested and treated. This includes all sexual partners within the past 60 days or your last sexual partner if last contact was greater than 60 days.  To minimize the risk of reinfection, you should abstain from sexual intercourse until your sexual partners have been tested and treated.   Consistent condom use is important in preventing the spread of sexually transmitted infections.  We will treat for any other positive results when your labs come back. If you do not hear from us, this means that your STI testing was negative or there is no change to your treatment plan. You will also receive these results via MyChart.   Return if you experience fevers 100.4 or greater, worsening or uncontrolled pain, rashes, sores, vomiting, or for any other concerning symptoms.   
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
## 2023-02-09 LAB — CYTOLOGY, (ORAL, ANAL, URETHRAL) ANCILLARY ONLY
Chlamydia: POSITIVE — AB
Comment: NEGATIVE
Comment: NEGATIVE
Comment: NORMAL
Neisseria Gonorrhea: NEGATIVE
Trichomonas: NEGATIVE

## 2023-07-09 ENCOUNTER — Other Ambulatory Visit: Payer: Self-pay

## 2023-07-09 ENCOUNTER — Encounter (HOSPITAL_COMMUNITY): Payer: Self-pay

## 2023-07-09 ENCOUNTER — Emergency Department (HOSPITAL_COMMUNITY)
Admission: EM | Admit: 2023-07-09 | Discharge: 2023-07-09 | Disposition: A | Discharge status: Home / Self Care | Payer: Self-pay | Attending: Emergency Medicine | Admitting: Emergency Medicine

## 2023-07-09 DIAGNOSIS — R519 Headache, unspecified: Secondary | ICD-10-CM | POA: Insufficient documentation

## 2023-07-09 DIAGNOSIS — R200 Anesthesia of skin: Secondary | ICD-10-CM | POA: Insufficient documentation

## 2023-07-09 DIAGNOSIS — M791 Myalgia, unspecified site: Secondary | ICD-10-CM | POA: Insufficient documentation

## 2023-07-09 LAB — RAPID URINE DRUG SCREEN, HOSP PERFORMED
Amphetamines: POSITIVE — AB
Barbiturates: NOT DETECTED
Benzodiazepines: NOT DETECTED
Cocaine: NOT DETECTED
Opiates: NOT DETECTED
Tetrahydrocannabinol: NOT DETECTED

## 2023-07-09 LAB — COMPREHENSIVE METABOLIC PANEL
ALT: 28 U/L (ref 0–44)
AST: 31 U/L (ref 15–41)
Albumin: 4.1 g/dL (ref 3.5–5.0)
Alkaline Phosphatase: 60 U/L (ref 38–126)
Anion gap: 15 (ref 5–15)
BUN: 10 mg/dL (ref 6–20)
CO2: 21 mmol/L — ABNORMAL LOW (ref 22–32)
Calcium: 9.2 mg/dL (ref 8.9–10.3)
Chloride: 102 mmol/L (ref 98–111)
Creatinine, Ser: 1.09 mg/dL (ref 0.61–1.24)
GFR, Estimated: >60 mL/min (ref >60)
Glucose, Bld: 97 mg/dL (ref 70–99)
Potassium: 3.5 mmol/L (ref 3.5–5.1)
Sodium: 138 mmol/L (ref 135–145)
Total Bilirubin: 0.7 mg/dL (ref <1.2)
Total Protein: 7.7 g/dL (ref 6.5–8.1)

## 2023-07-09 LAB — ETHANOL: Alcohol, Ethyl (B): 66 mg/dL — ABNORMAL HIGH (ref <10)

## 2023-07-09 LAB — CBC WITH DIFFERENTIAL/PLATELET
Abs Immature Granulocytes: 0.02 10*3/uL (ref 0.00–0.07)
Basophils Absolute: 0.1 10*3/uL (ref 0.0–0.1)
Basophils Relative: 1 %
Eosinophils Absolute: 0.3 10*3/uL (ref 0.0–0.5)
Eosinophils Relative: 3 %
HCT: 46.3 % (ref 39.0–52.0)
Hemoglobin: 15.8 g/dL (ref 13.0–17.0)
Immature Granulocytes: 0 %
Lymphocytes Relative: 62 %
Lymphs Abs: 5.6 10*3/uL — ABNORMAL HIGH (ref 0.7–4.0)
MCH: 28.8 pg (ref 26.0–34.0)
MCHC: 34.1 g/dL (ref 30.0–36.0)
MCV: 84.5 fL (ref 80.0–100.0)
Monocytes Absolute: 0.9 10*3/uL (ref 0.1–1.0)
Monocytes Relative: 10 %
Neutro Abs: 2.1 10*3/uL (ref 1.7–7.7)
Neutrophils Relative %: 24 %
Platelets: 336 10*3/uL (ref 150–400)
RBC: 5.48 MIL/uL (ref 4.22–5.81)
RDW: 13.1 % (ref 11.5–15.5)
Smear Review: NORMAL
WBC: 9 10*3/uL (ref 4.0–10.5)
nRBC: 0 % (ref 0.0–0.2)

## 2023-07-09 MED ORDER — ONDANSETRON HCL 4 MG/2ML IJ SOLN: 4.0000 mg | Freq: Once | INTRAMUSCULAR | Status: DC

## 2023-07-09 MED ORDER — FAMOTIDINE IN NACL 20-0.9 MG/50ML-% IV SOLN: 20.0000 mg | Freq: Once | INTRAVENOUS | Status: DC

## 2023-07-09 MED ORDER — ONDANSETRON 4 MG PO TBDP
4.0000 mg | ORAL_TABLET | Freq: Once | ORAL | Status: AC
  Administered 2023-07-09: 4 mg via ORAL
  Filled 2023-07-09: qty 1

## 2023-07-09 MED ORDER — ONDANSETRON HCL 4 MG PO TABS: 4.0000 mg | ORAL_TABLET | Freq: Three times a day (TID) | ORAL | Status: DC | PRN

## 2023-07-09 MED ORDER — KETOROLAC TROMETHAMINE 30 MG/ML IJ SOLN
30.0000 mg | Freq: Once | INTRAMUSCULAR | Status: DC
Start: 2023-07-09 — End: 2023-07-09

## 2023-07-09 MED ORDER — FAMOTIDINE 20 MG PO TABS
20.0000 mg | ORAL_TABLET | Freq: Once | ORAL | Status: AC
  Administered 2023-07-09: 20 mg via ORAL
  Filled 2023-07-09: qty 1

## 2023-07-09 MED ORDER — KETOROLAC TROMETHAMINE 30 MG/ML IJ SOLN
30.0000 mg | Freq: Once | INTRAMUSCULAR | Status: AC
  Administered 2023-07-09: 30 mg via INTRAMUSCULAR
  Filled 2023-07-09: qty 1
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
## 2023-07-09 NOTE — Discharge Instructions (Addendum)
You have been seen and discharged from the emergency department.  Your blood work was normal. You given medications here.  A prescription of medication has been sent for further symptom control today.  Follow-up with your primary provider for further evaluation and further care. Take home medications as prescribed. If you have any worsening symptoms or further concerns for your health please return to an emergency department for further evaluation.
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
## 2023-12-29 ENCOUNTER — Encounter (HOSPITAL_COMMUNITY): Payer: Self-pay | Admitting: *Deleted

## 2023-12-29 ENCOUNTER — Other Ambulatory Visit: Payer: Self-pay

## 2023-12-29 ENCOUNTER — Emergency Department (HOSPITAL_COMMUNITY)
Admission: EM | Admit: 2023-12-29 | Discharge: 2023-12-29 | Disposition: A | Discharge status: Home / Self Care | Payer: Self-pay | Attending: Emergency Medicine | Admitting: Emergency Medicine

## 2023-12-29 DIAGNOSIS — Z113 Encounter for screening for infections with a predominantly sexual mode of transmission: Secondary | ICD-10-CM | POA: Insufficient documentation

## 2023-12-29 LAB — COMPREHENSIVE METABOLIC PANEL WITH GFR
ALT: 19 U/L (ref 0–44)
AST: 26 U/L (ref 15–41)
Albumin: 4 g/dL (ref 3.5–5.0)
Alkaline Phosphatase: 52 U/L (ref 38–126)
Anion gap: 11 (ref 5–15)
BUN: 9 mg/dL (ref 6–20)
CO2: 23 mmol/L (ref 22–32)
Calcium: 9.2 mg/dL (ref 8.9–10.3)
Chloride: 103 mmol/L (ref 98–111)
Creatinine, Ser: 1.18 mg/dL (ref 0.61–1.24)
GFR, Estimated: >60 mL/min (ref >60)
Glucose, Bld: 89 mg/dL (ref 70–99)
Potassium: 3.8 mmol/L (ref 3.5–5.1)
Sodium: 137 mmol/L (ref 135–145)
Total Bilirubin: 0.5 mg/dL (ref 0.0–1.2)
Total Protein: 7 g/dL (ref 6.5–8.1)

## 2023-12-29 LAB — URINALYSIS, ROUTINE W REFLEX MICROSCOPIC
Bilirubin Urine: NEGATIVE
Glucose, UA: NEGATIVE mg/dL
Hgb urine dipstick: NEGATIVE
Ketones, ur: NEGATIVE mg/dL
Leukocytes,Ua: NEGATIVE
Nitrite: NEGATIVE
Protein, ur: NEGATIVE mg/dL
Specific Gravity, Urine: >1.03 — ABNORMAL HIGH (ref 1.005–1.030)
pH: 6 (ref 5.0–8.0)

## 2023-12-29 LAB — CBC
HCT: 40.6 % (ref 39.0–52.0)
Hemoglobin: 14.2 g/dL (ref 13.0–17.0)
MCH: 30 pg (ref 26.0–34.0)
MCHC: 35 g/dL (ref 30.0–36.0)
MCV: 85.7 fL (ref 80.0–100.0)
Platelets: 256 10*3/uL (ref 150–400)
RBC: 4.74 MIL/uL (ref 4.22–5.81)
RDW: 12.9 % (ref 11.5–15.5)
WBC: 6.4 10*3/uL (ref 4.0–10.5)
nRBC: 0 % (ref 0.0–0.2)

## 2023-12-29 MED ORDER — CEFTRIAXONE SODIUM 500 MG IJ SOLR
250.0000 mg | Freq: Once | INTRAMUSCULAR | Status: AC
  Administered 2023-12-29: 250 mg via INTRAMUSCULAR
  Filled 2023-12-29: qty 500

## 2023-12-29 MED ORDER — STERILE WATER FOR INJECTION IJ SOLN
INTRAMUSCULAR | Status: AC
  Administered 2023-12-29: 10 mL
  Filled 2023-12-29: qty 10

## 2023-12-29 MED ORDER — AZITHROMYCIN 250 MG PO TABS
1000.0000 mg | ORAL_TABLET | Freq: Once | ORAL | Status: AC
  Administered 2023-12-29: 1000 mg via ORAL

## 2023-12-29 MED ORDER — AZITHROMYCIN 250 MG PO TABS
1000.0000 mg | ORAL_TABLET | Freq: Every day | ORAL | Status: DC
  Filled 2023-12-29: qty 4

## 2023-12-29 NOTE — ED Provider Notes (Signed)
 [place] EMERGENCY DEPARTMENT AT [facility] Provider Note   CSN: [id number] Arrival date & time: 12/29/23  0121     History  Chief Complaint  Patient presents with   Groin Pain    Vincent West is a 33 y.o. male.  The history is provided by the patient and medical records.  Groin Pain   33 year old male presenting to the ED with penile discharge.  States for the past 2 days he has noticed some urethral irritation, urinary frequency, and dysuria.  Yesterday at work he noticed some yellow appearing penile discharge.  History of same associated with STD.  Does report new sexual partner and is concerned for this.  Denies any fever, chills, rash, or genital lesions.  Home Medications Prior to Admission medications   Medication Sig Start Date End Date Taking? Authorizing Provider  ondansetron  (ZOFRAN ) 4 MG tablet Take 1 tablet (4 mg total) by mouth every 8 (eight) hours as needed for nausea or vomiting. 07/09/23   Horton, Sidra Dredge, DO      Allergies    Patient has no known allergies.    Review of Systems   Review of Systems  Genitourinary:  Positive for dysuria, frequency and penile discharge.  All other systems reviewed and are negative.   Physical Exam Updated Vital Signs BP (!) 150/96   Pulse (!) 58   Temp 98.4 F (36.9 C)   Resp 17   Ht 5\' 5"  (1.651 m)   Wt 77.1 kg   SpO2 100%   BMI 28.29 kg/m   Physical Exam Vitals and nursing note reviewed.  Constitutional:      Appearance: He is well-developed.  HENT:     Head: Normocephalic and atraumatic.  Eyes:     Conjunctiva/sclera: Conjunctivae normal.     Pupils: Pupils are equal, round, and reactive to light.  Cardiovascular:     Rate and Rhythm: Normal rate and regular rhythm.     Heart sounds: Normal heart sounds.  Pulmonary:     Effort: Pulmonary effort is normal.     Breath sounds: Normal breath sounds.  Musculoskeletal:        General: Normal range of motion.     Cervical back: Normal  range of motion.  Skin:    General: Skin is warm and dry.  Neurological:     Mental Status: He is alert and oriented to person, place, and time.     ED Results / Procedures / Treatments   Labs (all labs ordered are listed, but only abnormal results are displayed) Labs Reviewed  URINALYSIS, ROUTINE W REFLEX MICROSCOPIC - Abnormal; Notable for the following components:      Result Value   Specific Gravity, Urine >1.030 (*)    All other components within normal limits  CBC  COMPREHENSIVE METABOLIC PANEL WITH GFR  GC/CHLAMYDIA PROBE AMP ([place]) NOT AT [facility]    EKG None  Radiology No results found.  Procedures Procedures    Medications Ordered in ED Medications  cefTRIAXone  (ROCEPHIN ) injection 250 mg (250 mg Intramuscular Given 12/29/23 0420)  sterile water  (preservative free) injection (10 mLs  Given 12/29/23 0420)  azithromycin  (ZITHROMAX ) tablet 1,000 mg (1,000 mg Oral Given 12/29/23 0419)    ED Course/ Medical Decision Making/ A&P                                 Medical Decision Making Amount and/or Complexity of Data  Reviewed Labs: ordered. ECG/medicine tests: ordered and independent interpretation performed.  Risk Prescription drug management.   33 year old male presenting to the ED with 2 days of dysuria, urinary frequency, now penile discharge.  Does report new sexual partners concern for possible STD.  Denies any genital lesions or rash.  He is afebrile and nontoxic in appearance here.  Screening labs were obtained from triage--no leukocytosis or electrolyte derangement.  UA is not consistent with infection.  Will add on Gc/Chl testing.  As patient is symptomatic opted for empiric treatment which I think is reasonable.  Advised he will be notified in 24 to 48 hours if STD results are positive, if so will need to notify partner.  These will also update into MyChart. Recommended further STD screenings at the health dept. Can return here for any new  concerns.  Final Clinical Impression(s) / ED Diagnoses Final diagnoses:  Screening for STD (sexually transmitted disease)    Rx / DC Orders ED Discharge Orders     None         Coretha Dew, PA-C 12/29/23 0451    Eve Hinders, MD 12/30/23 [phone]

## 2023-12-29 NOTE — Discharge Instructions (Signed)
 You will be notified if STD testing is positive. Can follow-up with the health dept for FREE testing in the future.

## 2023-12-29 NOTE — ED Triage Notes (Signed)
 Pt c/o painful urination for 2 days no blood  frequent irritation

## 2023-12-30 LAB — GC/CHLAMYDIA PROBE AMP (~~LOC~~) NOT AT ARMC
Chlamydia: NEGATIVE
Comment: NEGATIVE
Comment: NORMAL
Neisseria Gonorrhea: NEGATIVE

## 2024-03-07 ENCOUNTER — Encounter (HOSPITAL_COMMUNITY): Payer: Self-pay

## 2024-03-07 ENCOUNTER — Ambulatory Visit (HOSPITAL_COMMUNITY)
Admission: EM | Admit: 2024-03-07 | Discharge: 2024-03-07 | Disposition: A | Discharge status: Home / Self Care | Payer: Self-pay | Attending: Physician Assistant | Admitting: Physician Assistant

## 2024-03-07 DIAGNOSIS — K047 Periapical abscess without sinus: Secondary | ICD-10-CM

## 2024-03-07 DIAGNOSIS — H9202 Otalgia, left ear: Secondary | ICD-10-CM

## 2024-03-07 DIAGNOSIS — K0889 Other specified disorders of teeth and supporting structures: Secondary | ICD-10-CM

## 2024-03-07 LAB — POCT RAPID STREP A (OFFICE): Rapid Strep A Screen: NEGATIVE

## 2024-03-07 MED ORDER — IBUPROFEN 600 MG PO TABS
600.0000 mg | ORAL_TABLET | Freq: Three times a day (TID) | ORAL | 0 refills | Status: AC | PRN
Start: 2024-03-07 — End: ?

## 2024-03-07 MED ORDER — AMOXICILLIN-POT CLAVULANATE 875-125 MG PO TABS: 1.0000 | ORAL_TABLET | Freq: Two times a day (BID) | ORAL | 0 refills | Status: DC

## 2024-03-07 NOTE — ED Provider Notes (Signed)
 [facility]    CSN: [id number] Arrival date & time: 03/07/24  1812      History   Chief Complaint Chief Complaint  Patient presents with   Sore Throat    HPI Vincent West is a 33 y.o. male.   Patient presents today with a 3-day history of worsening left throat/jaw pain.  He reports that pain has been rated 10 on a 0-10 pain scale but is currently 8.5 on a 0-10 pain scale, described as sharp, worse with swallowing, no alleviating factors identified.  Reports that symptoms started in his throat/jaw but then have spread to involve his ear.  He denies any recent swimming or airplane travel.  Denies any associated otorrhea.  He has had some mild congestion but denies any additional symptoms such as fever, cough, shortness of breath, chest pain, nausea, vomiting.  He has not taken any over-the-counter medication for symptom management.  Denies any recent dental procedure.  He is eating primarily using the right side of his mouth but has been able to swallow and denies any associated swelling of his throat, shortness of breath, muffled voice.  Denies any recent antibiotics but review of chart indicates that he was treated with azithromycin  and Rocephin  12/29/2023 for concern of STI.    Past Medical History:  Diagnosis Date   Asthma     There are no active problems to display for this patient.   History reviewed. No pertinent surgical history.     Home Medications    Prior to Admission medications   Medication Sig Start Date End Date Taking? Authorizing Provider  amoxicillin -clavulanate (AUGMENTIN ) 875-125 MG tablet Take 1 tablet by mouth every 12 (twelve) hours. 03/07/24  Yes Melana Hingle K, PA-C  ibuprofen  (ADVIL ) 600 MG tablet Take 1 tablet (600 mg total) by mouth every 8 (eight) hours as needed. 03/07/24  Yes Eyonna Sandstrom, Rocky POUR, PA-C    Family History Family History  Problem Relation Age of Onset   Healthy Mother     Social History Social History   Tobacco Use    Smoking status: Former    Current packs/day: 0.00    Types: Cigarettes    Quit date: 04/01/2016    Years since quitting: 7.9   Smokeless tobacco: Never  Vaping Use   Vaping status: Some Days  Substance Use Topics   Alcohol use: Yes    Comment: occ    Drug use: Not Currently    Types: Marijuana    Comment: molly     Allergies   Patient has no known allergies.   Review of Systems Review of Systems  Constitutional:  Positive for activity change. Negative for appetite change, fatigue and fever.  HENT:  Positive for dental problem and ear pain. Negative for congestion, sinus pressure, sneezing, sore throat and trouble swallowing.   Respiratory:  Negative for shortness of breath.   Cardiovascular:  Negative for chest pain.  Gastrointestinal:  Negative for abdominal pain, diarrhea, nausea and vomiting.     Physical Exam Triage Vital Signs ED Triage Vitals [03/07/24 1837]  Encounter Vitals Group     BP (!) 147/97     Girls Systolic BP Percentile      Girls Diastolic BP Percentile      Boys Systolic BP Percentile      Boys Diastolic BP Percentile      Pulse Rate 88     Resp 16     Temp 98.5 F (36.9 C)     Temp Source  Oral     SpO2 97 %     Weight      Height      Head Circumference      Peak Flow      Pain Score 8     Pain Loc      Pain Education      Exclude from Growth Chart    No data found.  Updated Vital Signs BP (!) 147/97 (BP Location: Left Arm)   Pulse 88   Temp 98.5 F (36.9 C) (Oral)   Resp 16   SpO2 97%   Visual Acuity Right Eye Distance:   Left Eye Distance:   Bilateral Distance:    Right Eye Near:   Left Eye Near:    Bilateral Near:     Physical Exam Vitals reviewed.  Constitutional:      General: He is awake.     Appearance: Normal appearance. He is well-developed. He is not ill-appearing.     Comments: Very pleasant male appears stated age in no acute distress sitting comfortably in exam room  HENT:     Head: Normocephalic and  atraumatic.     Right Ear: Tympanic membrane, ear canal and external ear normal. Tympanic membrane is not erythematous or bulging.     Left Ear: Tympanic membrane, ear canal and external ear normal. Tympanic membrane is not erythematous or bulging.     Nose: Nose normal.     Mouth/Throat:     Dentition: Abnormal dentition. Dental tenderness and gingival swelling present. No dental abscesses.     Pharynx: Uvula midline. No oropharyngeal exudate, posterior oropharyngeal erythema or uvula swelling.      Comments: Tooth #18 is broken with surrounding edema.  No evidence of Ludwig angina. Cardiovascular:     Rate and Rhythm: Normal rate and regular rhythm.     Heart sounds: Normal heart sounds, S1 normal and S2 normal. No murmur heard. Pulmonary:     Effort: Pulmonary effort is normal. No accessory muscle usage or respiratory distress.     Breath sounds: Normal breath sounds. No stridor. No wheezing, rhonchi or rales.     Comments: Clear to auscultation bilaterally Lymphadenopathy:     Head:     Right side of head: No submental, submandibular or tonsillar adenopathy.     Left side of head: No submental, submandibular or tonsillar adenopathy.     Cervical: No cervical adenopathy.  Neurological:     Mental Status: He is alert.  Psychiatric:        Behavior: Behavior is cooperative.      UC Treatments / Results  Labs (all labs ordered are listed, but only abnormal results are displayed) Labs Reviewed  POCT RAPID STREP A (OFFICE)    EKG   Radiology No results found.  Procedures Procedures (including critical care time)  Medications Ordered in UC Medications - No data to display  Initial Impression / Assessment and Plan / UC Course  I have reviewed the triage vital signs and the nursing notes.  Pertinent labs & imaging results that were available during my care of the patient were reviewed by me and considered in my medical decision making (see chart for details).      Patient is well-appearing, afebrile, nontoxic, nontachycardic.  No indication for emergent evaluation or imaging.  Strep testing was obtained in triage and was negative.  Patient was treated for dental infection with Augmentin  twice daily for 10 days given clinical exam findings.  He was given  ibuprofen  600 mg for pain relief with instruction to take this with food to prevent GI upset.  He is to avoid use of additional NSAIDs.  Can use Tylenol  for breakthrough pain.  No indication for dose adjustment based on metabolic panel from 12/29/2023 with creatinine of 1.18 and correlating creatinine clearance of 99 mL/min.  Recommend he gargle with warm salt water  for additional symptom relief.  Discussed that ultimately he will need to see dentist to address underlying tooth.  He was provided low-cost dental resources in the area with after visit summary.  If he develops any worsening symptoms including fever, nausea, vomiting, swelling of his throat, muffled voice, dysphagia he needs to go to the emergency room to which he expressed understanding.  Work excuse note declined.   Final Clinical Impressions(s) / UC Diagnoses   Final diagnoses:  Dental infection  Acute otalgia, left  Dentalgia     Discharge Instructions      Start Augmentin  twice daily for 10 days.  Use ibuprofen  600 mg up to 3 times a day for pain relief.  You should avoid NSAIDs including aspirin, ibuprofen /Advil , naproxen/Aleve with this medication as it causes stomach bleeding.  You can use Tylenol  for breakthrough pain.  Gargle with warm salt water  for additional symptom relief.  You should follow-up with dentist; call to schedule an appointment.  If you develop any worsening symptoms including difficulty swallowing, difficulty speaking, swelling of your throat, high fever, change in your voice you need to be seen immediately.      ED Prescriptions     Medication Sig Dispense Auth. Provider   amoxicillin -clavulanate (AUGMENTIN )  875-125 MG tablet Take 1 tablet by mouth every 12 (twelve) hours. 20 tablet Jarmaine Ehrler K, PA-C   ibuprofen  (ADVIL ) 600 MG tablet Take 1 tablet (600 mg total) by mouth every 8 (eight) hours as needed. 21 tablet Vanecia Limpert K, PA-C      PDMP not reviewed this encounter.   Sherrell Rocky POUR, PA-C 03/07/24 1856

## 2024-03-07 NOTE — Discharge Instructions (Addendum)
 Start Augmentin twice daily for 10 days.  Use ibuprofen 600 mg up to 3 times a day for pain relief.  You should avoid NSAIDs including aspirin, ibuprofen/Advil, naproxen/Aleve with this medication as it causes stomach bleeding.  You can use Tylenol for breakthrough pain.  Gargle with warm salt water for additional symptom relief.  You should follow-up with dentist; call to schedule an appointment.  If you develop any worsening symptoms including difficulty swallowing, difficulty speaking, swelling of your throat, high fever, change in your voice you need to be seen immediately.

## 2024-03-07 NOTE — ED Triage Notes (Signed)
 Pt c/o pain to lt side of throat radiating to lt ear since Monday. States having some congestion. Denies cough or taken any meds.

## 2024-04-13 ENCOUNTER — Other Ambulatory Visit: Payer: Self-pay

## 2024-04-13 ENCOUNTER — Encounter (HOSPITAL_COMMUNITY): Payer: Self-pay | Admitting: Emergency Medicine

## 2024-04-13 ENCOUNTER — Emergency Department (HOSPITAL_COMMUNITY)
Admission: EM | Admit: 2024-04-13 | Discharge: 2024-04-14 | Disposition: A | Discharge status: Home / Self Care | Payer: Self-pay | Attending: Emergency Medicine | Admitting: Emergency Medicine

## 2024-04-13 DIAGNOSIS — R519 Headache, unspecified: Secondary | ICD-10-CM | POA: Insufficient documentation

## 2024-04-13 MED ORDER — IBUPROFEN 400 MG PO TABS
400.0000 mg | ORAL_TABLET | Freq: Once | ORAL | Status: AC
  Administered 2024-04-13: 400 mg via ORAL

## 2024-04-13 NOTE — ED Triage Notes (Signed)
 Pt in ambulatory with reported HA since waking this morning, and dizziness x a few hours.

## 2024-04-14 MED ORDER — AMOXICILLIN-POT CLAVULANATE 875-125 MG PO TABS: 1.0000 | ORAL_TABLET | Freq: Two times a day (BID) | ORAL | 0 refills | Status: AC

## 2024-04-14 MED ORDER — AMOXICILLIN-POT CLAVULANATE 875-125 MG PO TABS
1.0000 | ORAL_TABLET | Freq: Once | ORAL | Status: AC
  Administered 2024-04-14: 1 via ORAL
  Filled 2024-04-14: qty 1

## 2024-04-14 MED ORDER — BUTALBITAL-APAP-CAFFEINE 50-325-40 MG PO TABS
1.0000 | ORAL_TABLET | Freq: Once | ORAL | Status: AC
  Administered 2024-04-14: 1 via ORAL
  Filled 2024-04-14: qty 1

## 2024-04-14 MED ORDER — DEXAMETHASONE 4 MG PO TABS
10.0000 mg | ORAL_TABLET | Freq: Once | ORAL | Status: AC
  Administered 2024-04-14: 10 mg via ORAL
  Filled 2024-04-14: qty 3

## 2024-04-14 NOTE — Discharge Instructions (Signed)
 On exam there are some signs that you might be coming down with a viral illness.  I did start you on antibiotics for the possibility that this is a bacterial sinus infection.  Please follow-up with your family doctor in the office.  Please return for sudden worsening headache one-sided numbness or weakness or if you develop a fever and neck stiffness

## 2024-04-14 NOTE — ED Provider Notes (Signed)
 [facility] Provider Note   CSN: [id number] Arrival date & time: 04/13/24  2201     Patient presents with: Headache   Vincent West is a 33 y.o. male.   33 yo M with a chief complaints of headache.  He said he woke up this morning and had a dull headache to the frontal region of his head and has gotten mildly worse throughout the day.  Does not normally get headaches and so this bothered him.  Denies cough congestion or fever.  Denies trauma.  Denies one-sided numbness or weakness denies difficulty speech or swallowing.   Headache      Prior to Admission medications   Medication Sig Start Date End Date Taking? Authorizing Provider  amoxicillin -clavulanate (AUGMENTIN ) 875-125 MG tablet Take 1 tablet by mouth every 12 (twelve) hours. 04/14/24  Yes Emil Share, DO  ibuprofen  (ADVIL ) 600 MG tablet Take 1 tablet (600 mg total) by mouth every 8 (eight) hours as needed. 03/07/24   Raspet, Erin K, PA-C    Allergies: Patient has no known allergies.    Review of Systems  Neurological:  Positive for headaches.    Updated Vital Signs BP (!) 141/99 (BP Location: Right Arm)   Pulse (!) 55   Temp 98 F (36.7 C) (Oral)   Resp 20   Ht 5' 5 (1.651 m)   Wt 77.1 kg   SpO2 96%   BMI 28.29 kg/m   Physical Exam Vitals and nursing note reviewed.  Constitutional:      Appearance: He is well-developed.  HENT:     Head: Normocephalic and atraumatic.     Mouth/Throat:     Comments: Swollen turbinates posterior nasal drip. Eyes:     Pupils: Pupils are equal, round, and reactive to light.  Neck:     Vascular: No JVD.  Cardiovascular:     Rate and Rhythm: Normal rate and regular rhythm.     Heart sounds: No murmur heard.    No friction rub. No gallop.  Pulmonary:     Effort: No respiratory distress.     Breath sounds: No wheezing.  Abdominal:     General: There is no distension.     Tenderness: There is no abdominal tenderness. There is  no guarding or rebound.  Musculoskeletal:        General: Normal range of motion.     Cervical back: Normal range of motion and neck supple.  Skin:    Coloration: Skin is not pale.     Findings: No rash.  Neurological:     Mental Status: He is alert and oriented to person, place, and time.     GCS: GCS eye subscore is 4. GCS verbal subscore is 5. GCS motor subscore is 6.     Cranial Nerves: Cranial nerves 2-12 are intact.     Sensory: Sensation is intact.     Motor: Motor function is intact.     Coordination: Coordination is intact.     Comments: Benign neurologic exam  Psychiatric:        Behavior: Behavior normal.     (all labs ordered are listed, but only abnormal results are displayed) Labs Reviewed - No data to display  EKG: None  Radiology: No results found.   Procedures   Medications Ordered in the ED  butalbital -acetaminophen -caffeine  (FIORICET ) 50-325-40 MG per tablet 1 tablet (has no administration in time range)  dexamethasone  (DECADRON ) tablet 10 mg (has no administration in time  range)  amoxicillin -clavulanate (AUGMENTIN ) 875-125 MG per tablet 1 tablet (has no administration in time range)  ibuprofen  (ADVIL ) tablet 400 mg (400 mg Oral Given 04/13/24 2217)                                    Medical Decision Making Risk Prescription drug management.   33 yo M with a chief complaint of a frontal headache.  Going on since this morning.  Slow in onset denied any other obvious neurologic symptoms.  Has a benign exam here.  He does have some signs of an upper respiratory illness.  Will treat for possible sinusitis.  I offered a headache cocktail which she is declining.  PCP follow-up.  2:48 AM:  I have discussed the diagnosis/risks/treatment options with the patient and family.  Evaluation and diagnostic testing in the emergency department does not suggest an emergent condition requiring admission or immediate intervention beyond what has been performed at this  time.  They will follow up with PCP. We also discussed returning to the ED immediately if new or worsening sx occur. We discussed the sx which are most concerning (e.g., sudden worsening pain, fever, inability to tolerate by mouth) that necessitate immediate return. Medications administered to the patient during their visit and any new prescriptions provided to the patient are listed below.  Medications given during this visit Medications  butalbital -acetaminophen -caffeine  (FIORICET ) 50-325-40 MG per tablet 1 tablet (has no administration in time range)  dexamethasone  (DECADRON ) tablet 10 mg (has no administration in time range)  amoxicillin -clavulanate (AUGMENTIN ) 875-125 MG per tablet 1 tablet (has no administration in time range)  ibuprofen  (ADVIL ) tablet 400 mg (400 mg Oral Given 04/13/24 2217)     The patient appears reasonably screen and/or stabilized for discharge and I doubt any other medical condition or other [facility] requiring further screening, evaluation, or treatment in the ED at this time prior to discharge.       Final diagnoses:  Frontal headache    ED Discharge Orders          Ordered    amoxicillin -clavulanate (AUGMENTIN ) 875-125 MG tablet  Every 12 hours        04/14/24 0246               Emil Share, DO 04/14/24 [phone]

## 2024-06-21 ENCOUNTER — Emergency Department (HOSPITAL_COMMUNITY)
Admission: EM | Admit: 2024-06-21 | Discharge: 2024-06-22 | Discharge status: Against Medical Advice | Payer: Self-pay | Attending: Emergency Medicine | Admitting: Emergency Medicine

## 2024-06-21 ENCOUNTER — Other Ambulatory Visit: Payer: Self-pay

## 2024-06-21 DIAGNOSIS — Z5321 Procedure and treatment not carried out due to patient leaving prior to being seen by health care provider: Secondary | ICD-10-CM | POA: Insufficient documentation

## 2024-06-21 DIAGNOSIS — R519 Headache, unspecified: Secondary | ICD-10-CM | POA: Insufficient documentation

## 2024-06-21 MED ORDER — IBUPROFEN 400 MG PO TABS
400.0000 mg | ORAL_TABLET | Freq: Once | ORAL | Status: AC | PRN
  Administered 2024-06-21: 400 mg via ORAL
  Filled 2024-06-21: qty 1

## 2024-06-21 NOTE — ED Triage Notes (Addendum)
 Pt arrives POV with complaints of a headache since waking up on 11/24. States he tooke OTC medication with slight relief but pain continues to come back. Complains of many stressors recently. Denies any injury and other neuro sx

## 2024-06-22 NOTE — ED Notes (Addendum)
 Pt stated that his Gisele was 4 minutes away and he had to leave. Pt stated that he will come back tomorrow. Pt left AMA.   KM

## 2024-07-22 ENCOUNTER — Emergency Department (HOSPITAL_COMMUNITY): Payer: Self-pay

## 2024-07-22 ENCOUNTER — Emergency Department (HOSPITAL_COMMUNITY)
Admission: EM | Admit: 2024-07-22 | Discharge: 2024-07-23 | Payer: Self-pay | Attending: Emergency Medicine | Admitting: Emergency Medicine

## 2024-07-22 ENCOUNTER — Other Ambulatory Visit: Payer: Self-pay

## 2024-07-22 DIAGNOSIS — M545 Low back pain, unspecified: Secondary | ICD-10-CM | POA: Insufficient documentation

## 2024-07-22 DIAGNOSIS — Z5321 Procedure and treatment not carried out due to patient leaving prior to being seen by health care provider: Secondary | ICD-10-CM | POA: Insufficient documentation

## 2024-07-22 DIAGNOSIS — R0602 Shortness of breath: Secondary | ICD-10-CM | POA: Insufficient documentation

## 2024-07-22 LAB — CBC WITH DIFFERENTIAL/PLATELET
Abs Immature Granulocytes: 0.02 K/uL (ref 0.00–0.07)
Basophils Absolute: 0 K/uL (ref 0.0–0.1)
Basophils Relative: 1 %
Eosinophils Absolute: 0.2 K/uL (ref 0.0–0.5)
Eosinophils Relative: 3 %
HCT: 44.2 % (ref 39.0–52.0)
Hemoglobin: 15.5 g/dL (ref 13.0–17.0)
Immature Granulocytes: 0 %
Lymphocytes Relative: 41 %
Lymphs Abs: 2.5 K/uL (ref 0.7–4.0)
MCH: 29 pg (ref 26.0–34.0)
MCHC: 35.1 g/dL (ref 30.0–36.0)
MCV: 82.6 fL (ref 80.0–100.0)
Monocytes Absolute: 0.6 K/uL (ref 0.1–1.0)
Monocytes Relative: 10 %
Neutro Abs: 2.9 K/uL (ref 1.7–7.7)
Neutrophils Relative %: 45 %
Platelets: 346 K/uL (ref 150–400)
RBC: 5.35 MIL/uL (ref 4.22–5.81)
RDW: 13.2 % (ref 11.5–15.5)
WBC: 6.2 K/uL (ref 4.0–10.5)
nRBC: 0 % (ref 0.0–0.2)

## 2024-07-22 LAB — COMPREHENSIVE METABOLIC PANEL WITH GFR
ALT: 20 U/L (ref 0–44)
AST: 26 U/L (ref 15–41)
Albumin: 4.5 g/dL (ref 3.5–5.0)
Alkaline Phosphatase: 84 U/L (ref 38–126)
Anion gap: 13 (ref 5–15)
BUN: 9 mg/dL (ref 6–20)
CO2: 22 mmol/L (ref 22–32)
Calcium: 8.8 mg/dL — ABNORMAL LOW (ref 8.9–10.3)
Chloride: 104 mmol/L (ref 98–111)
Creatinine, Ser: 0.99 mg/dL (ref 0.61–1.24)
GFR, Estimated: >60 mL/min
Glucose, Bld: 182 mg/dL — ABNORMAL HIGH (ref 70–99)
Potassium: 3.7 mmol/L (ref 3.5–5.1)
Sodium: 139 mmol/L (ref 135–145)
Total Bilirubin: 0.4 mg/dL (ref 0.0–1.2)
Total Protein: 7.9 g/dL (ref 6.5–8.1)

## 2024-07-22 NOTE — ED Triage Notes (Signed)
 Patient reports low back pain and SOB this week , denies cough or fever . No injury or fall .

## 2024-07-23 NOTE — ED Notes (Signed)
 Pt called for vitals x2 with no response. Pt marked as eloped.
